# Patient Record
Sex: Male | Born: 1960 | Race: White | Hispanic: No | Marital: Single | Smoking: Never smoker
Health system: Southern US, Community
[De-identification: ages and names within clinical notes are randomized; demographics above are authoritative.]

## PROBLEM LIST (undated history)

## (undated) DIAGNOSIS — N200 Calculus of kidney: Secondary | ICD-10-CM

## (undated) DIAGNOSIS — Z87442 Personal history of urinary calculi: Secondary | ICD-10-CM

## (undated) HISTORY — PX: LITHOTRIPSY: SUR834

---

## 2008-05-11 DIAGNOSIS — Z86018 Personal history of other benign neoplasm: Secondary | ICD-10-CM

## 2008-05-11 HISTORY — DX: Personal history of other benign neoplasm: Z86.018

## 2012-11-03 DIAGNOSIS — N400 Enlarged prostate without lower urinary tract symptoms: Secondary | ICD-10-CM | POA: Insufficient documentation

## 2012-11-03 DIAGNOSIS — N320 Bladder-neck obstruction: Secondary | ICD-10-CM | POA: Insufficient documentation

## 2012-11-03 DIAGNOSIS — N50819 Testicular pain, unspecified: Secondary | ICD-10-CM | POA: Insufficient documentation

## 2012-12-02 DIAGNOSIS — E559 Vitamin D deficiency, unspecified: Secondary | ICD-10-CM | POA: Insufficient documentation

## 2012-12-02 DIAGNOSIS — Z87442 Personal history of urinary calculi: Secondary | ICD-10-CM | POA: Insufficient documentation

## 2015-09-02 ENCOUNTER — Ambulatory Visit: Payer: BLUE CROSS/BLUE SHIELD | Admitting: Certified Registered Nurse Anesthetist

## 2015-09-02 ENCOUNTER — Encounter: Payer: Self-pay | Admitting: Anesthesiology

## 2015-09-02 ENCOUNTER — Encounter
Admission: RE | Disposition: A | Discharge status: Home / Self Care | Payer: Self-pay | Source: Ambulatory Visit | Attending: Unknown Physician Specialty

## 2015-09-02 ENCOUNTER — Ambulatory Visit
Admission: RE | Admit: 2015-09-02 | Discharge: 2015-09-02 | Disposition: A | Discharge status: Home / Self Care | Payer: BLUE CROSS/BLUE SHIELD | Source: Ambulatory Visit | Attending: Unknown Physician Specialty | Admitting: Unknown Physician Specialty

## 2015-09-02 DIAGNOSIS — K640 First degree hemorrhoids: Secondary | ICD-10-CM | POA: Diagnosis not present

## 2015-09-02 DIAGNOSIS — D122 Benign neoplasm of ascending colon: Secondary | ICD-10-CM | POA: Diagnosis not present

## 2015-09-02 DIAGNOSIS — Z1211 Encounter for screening for malignant neoplasm of colon: Secondary | ICD-10-CM | POA: Diagnosis present

## 2015-09-02 HISTORY — PX: COLONOSCOPY WITH PROPOFOL: SHX5780

## 2015-09-02 SURGERY — COLONOSCOPY WITH PROPOFOL
Anesthesia: General

## 2015-09-02 MED ORDER — SODIUM CHLORIDE 0.9 % IV SOLN
INTRAVENOUS | Status: DC
  Administered 2015-09-02: 1000 mL via INTRAVENOUS

## 2015-09-02 MED ORDER — SODIUM CHLORIDE 0.9 % IV SOLN: INTRAVENOUS | Status: DC

## 2015-09-02 MED ORDER — FENTANYL CITRATE (PF) 100 MCG/2ML IJ SOLN
INTRAMUSCULAR | Status: DC | PRN
  Administered 2015-09-02: 50 ug via INTRAVENOUS

## 2015-09-02 MED ORDER — MIDAZOLAM HCL 5 MG/5ML IJ SOLN
INTRAMUSCULAR | Status: DC | PRN
  Administered 2015-09-02: 1 mg via INTRAVENOUS

## 2015-09-02 MED ORDER — LIDOCAINE HCL (CARDIAC) 20 MG/ML IV SOLN
INTRAVENOUS | Status: DC | PRN
  Administered 2015-09-02: 20 mg via INTRAVENOUS

## 2015-09-02 MED ORDER — PROPOFOL 500 MG/50ML IV EMUL
INTRAVENOUS | Status: DC | PRN
  Administered 2015-09-02: 140 ug/kg/min via INTRAVENOUS

## 2015-09-02 MED ORDER — PROPOFOL 10 MG/ML IV BOLUS
INTRAVENOUS | Status: DC | PRN
  Administered 2015-09-02: 40 mg via INTRAVENOUS

## 2015-09-02 NOTE — Op Note (Signed)
[facility] Gastroenterology Patient Name: Keith Jackson Procedure Date: 09/02/2015 7:57 AM MRN: [id number] Account #: [id number] Date of Birth: 01/01/1961 Admit Type: Outpatient Age: 54 Room: [facility] ENDO ROOM 1 Gender: Male Note Status: Finalized Procedure:         Colonoscopy Indications:       Screening for colorectal malignant neoplasm Providers:         Manya Silvas, MD Medicines:         Propofol per Anesthesia Complications:     No immediate complications. Procedure:         Pre-Anesthesia Assessment:                    - After reviewing the risks and benefits, the patient was                     deemed in satisfactory condition to undergo the procedure.                    After obtaining informed consent, the colonoscope was                     passed under direct vision. Throughout the procedure, the                     patient's blood pressure, pulse, and oxygen saturations                     were monitored continuously. The Colonoscope was                     introduced through the anus and advanced to the the cecum,                     identified by appendiceal orifice and ileocecal valve. The                     colonoscopy was performed without difficulty. The patient                     tolerated the procedure well. The quality of the bowel                     preparation was excellent. Findings:      A diminutive polyp was found in the distal ascending colon. The polyp       was sessile. The polyp was removed with a jumbo cold forceps. Resection       and retrieval were complete.      Internal hemorrhoids were found during endoscopy. The hemorrhoids were       small and Grade I (internal hemorrhoids that do not prolapse).      The exam was otherwise without abnormality. Impression:        - One diminutive polyp in the distal ascending colon.                     Resected and retrieved.                    - Internal hemorrhoids.      - The examination was otherwise normal. Recommendation:    - Await pathology results. Manya Silvas, MD 09/02/2015 8:28:33 AM This report has been signed electronically. Number of Addenda: 0 Note Initiated On: 09/02/2015 7:57 AM Scope Withdrawal Time: 0  hours 8 minutes 55 seconds  Total Procedure Duration: 0 hours 12 minutes 56 seconds       [facility]

## 2015-09-02 NOTE — Transfer of Care (Signed)
Immediate Anesthesia Transfer of Care Note  Patient: Keith Jackson  Procedure(s) Performed: Procedure(s): COLONOSCOPY WITH PROPOFOL (N/A)  Patient Location: PACU and Endoscopy Unit  Anesthesia Type:General  Level of Consciousness: awake, alert  and oriented  Airway & Oxygen Therapy: Patient Spontanous Breathing and Patient connected to nasal cannula oxygen  Post-op Assessment: Report given to RN and Post -op Vital signs reviewed and stable  Post vital signs: Reviewed and stable  Last Vitals:  Filed Vitals:   09/02/15 0831 09/02/15 0832  BP: 90/69   Pulse: 85   Temp: 36.1 C 36.1 C  Resp: 19     Complications: No apparent anesthesia complications

## 2015-09-02 NOTE — H&P (Signed)
   Primary Care Physician:  No PCP Per Patient Primary Gastroenterologist:  Dr. Vira Agar  Pre-Procedure History & Physical: HPI:  Keith Jackson is a 54 y.o. male is here for an colonoscopy.   No past medical history on file.  No past surgical history on file.  Prior to Admission medications   Not on File    Allergies as of 07/29/2015  . (Not on File)    No family history on file.  Social History   Social History  . Marital Status: Legally Separated    Spouse Name: N/A  . Number of Children: N/A  . Years of Education: N/A   Occupational History  . Not on file.   Social History Main Topics  . Smoking status: Not on file  . Smokeless tobacco: Not on file  . Alcohol Use: Not on file  . Drug Use: Not on file  . Sexual Activity: Not on file   Other Topics Concern  . Not on file   Social History Narrative  . No narrative on file    Review of Systems: See HPI, otherwise negative ROS  Physical Exam: BP 117/79 mmHg  Pulse 95  Temp(Src) 98.4 F (36.9 C) (Tympanic)  Resp 16  Ht 5\' 9"  (1.753 m)  Wt 72.576 kg (160 lb)  BMI 23.62 kg/m2  SpO2 97% General:   Alert,  pleasant and cooperative in NAD Head:  Normocephalic and atraumatic. Neck:  Supple; no masses or thyromegaly. Lungs:  Clear throughout to auscultation.    Heart:  Regular rate and rhythm. Abdomen:  Soft, nontender and nondistended. Normal bowel sounds, without guarding, and without rebound.   Neurologic:  Alert and  oriented x4;  grossly normal neurologically.  Impression/Plan: [name] is here for an colonoscopy to be performed for screening  Risks, benefits, limitations, and alternatives regarding  colonoscopy have been reviewed with the patient.  Questions have been answered.  All parties agreeable.   Gaylyn Cheers, MD  09/02/2015, 7:56 AM

## 2015-09-02 NOTE — Anesthesia Postprocedure Evaluation (Signed)
Anesthesia Post Note  Patient: Keith Jackson  Procedure(s) Performed: Procedure(s) (LRB): COLONOSCOPY WITH PROPOFOL (N/A)  Patient location during evaluation: Endoscopy Anesthesia Type: General Level of consciousness: awake and alert Pain management: pain level controlled Vital Signs Assessment: post-procedure vital signs reviewed and stable Respiratory status: spontaneous breathing, nonlabored ventilation, respiratory function stable and patient connected to nasal cannula oxygen Cardiovascular status: blood pressure returned to baseline and stable Postop Assessment: no signs of nausea or vomiting Anesthetic complications: no    Last Vitals:  Filed Vitals:   09/02/15 0840 09/02/15 0850  BP: 108/83 114/86  Pulse: 80 78  Temp:    Resp: 18 17    Last Pain: There were no vitals filed for this visit.               Precious Haws Jeneane Pieczynski

## 2015-09-02 NOTE — Anesthesia Procedure Notes (Signed)
Date/Time: 09/02/2015 8:12 AM Performed by: Kennon Holter Pre-anesthesia Checklist: Timeout performed, Patient being monitored, Suction available, Emergency Drugs available and Patient identified Patient Re-evaluated:Patient Re-evaluated prior to inductionOxygen Delivery Method: Nasal cannula Preoxygenation: Pre-oxygenation with 100% oxygen Intubation Type: IV induction

## 2015-09-02 NOTE — Anesthesia Preprocedure Evaluation (Signed)
Anesthesia Evaluation  Patient identified by MRN, date of birth, ID band Patient awake    Reviewed: Allergy & Precautions, H&P , NPO status , Patient's Chart, lab work & pertinent test results  Airway Mallampati: III  TM Distance: >3 FB Neck ROM: full    Dental no notable dental hx. (+) Teeth Intact   Pulmonary neg pulmonary ROS, neg shortness of breath,    Pulmonary exam normal breath sounds clear to auscultation       Cardiovascular Exercise Tolerance: Good negative cardio ROS Normal cardiovascular exam Rhythm:regular Rate:Normal     Neuro/Psych negative neurological ROS  negative psych ROS   GI/Hepatic negative GI ROS, Neg liver ROS,   Endo/Other  negative endocrine ROS  Renal/GU negative Renal ROS  negative genitourinary   Musculoskeletal   Abdominal   Peds  Hematology negative hematology ROS (+)   Anesthesia Other Findings Signs and symptoms suggestive of sleep apnea    BMI    Body Mass Index   23.61 kg/m 2      Reproductive/Obstetrics negative OB ROS                             Anesthesia Physical Anesthesia Plan  ASA: II  Anesthesia Plan: General   Post-op Pain Management:    Induction:   Airway Management Planned:   Additional Equipment:   Intra-op Plan:   Post-operative Plan:   Informed Consent: I have reviewed the patients History and Physical, chart, labs and discussed the procedure including the risks, benefits and alternatives for the proposed anesthesia with the patient or authorized representative who has indicated his/her understanding and acceptance.   Dental Advisory Given  Plan Discussed with: Anesthesiologist, CRNA and Surgeon  Anesthesia Plan Comments:         Anesthesia Quick Evaluation

## 2015-09-03 ENCOUNTER — Encounter: Payer: Self-pay | Admitting: Unknown Physician Specialty

## 2015-09-05 LAB — SURGICAL PATHOLOGY

## 2016-05-11 ENCOUNTER — Other Ambulatory Visit: Payer: Self-pay | Admitting: Internal Medicine

## 2016-05-11 DIAGNOSIS — L719 Rosacea, unspecified: Secondary | ICD-10-CM | POA: Insufficient documentation

## 2016-05-11 DIAGNOSIS — R51 Headache: Principal | ICD-10-CM

## 2016-05-11 DIAGNOSIS — R519 Headache, unspecified: Secondary | ICD-10-CM

## 2016-05-18 ENCOUNTER — Ambulatory Visit
Admission: RE | Admit: 2016-05-18 | Discharge: 2016-05-18 | Disposition: A | Discharge status: Home / Self Care | Payer: Managed Care, Other (non HMO) | Source: Ambulatory Visit | Attending: Internal Medicine | Admitting: Internal Medicine

## 2016-05-18 ENCOUNTER — Telehealth: Payer: Self-pay | Admitting: Radiology

## 2016-05-18 DIAGNOSIS — R51 Headache: Secondary | ICD-10-CM | POA: Insufficient documentation

## 2016-05-18 DIAGNOSIS — R519 Headache, unspecified: Secondary | ICD-10-CM

## 2016-08-27 ENCOUNTER — Other Ambulatory Visit: Payer: Self-pay | Admitting: Specialist

## 2016-08-27 DIAGNOSIS — G935 Compression of brain: Secondary | ICD-10-CM

## 2016-08-29 ENCOUNTER — Ambulatory Visit
Admission: RE | Admit: 2016-08-29 | Discharge: 2016-08-29 | Disposition: A | Discharge status: Home / Self Care | Payer: Managed Care, Other (non HMO) | Source: Ambulatory Visit | Attending: Specialist | Admitting: Specialist

## 2016-08-29 DIAGNOSIS — I6203 Nontraumatic chronic subdural hemorrhage: Secondary | ICD-10-CM | POA: Diagnosis not present

## 2016-08-29 DIAGNOSIS — G935 Compression of brain: Secondary | ICD-10-CM | POA: Diagnosis not present

## 2016-08-29 MED ORDER — GADOBENATE DIMEGLUMINE 529 MG/ML IV SOLN
15.0000 mL | Freq: Once | INTRAVENOUS | Status: AC | PRN
  Administered 2016-08-29: 15 mL via INTRAVENOUS

## 2016-09-05 ENCOUNTER — Other Ambulatory Visit: Payer: Self-pay | Admitting: Specialist

## 2016-09-05 DIAGNOSIS — G4489 Other headache syndrome: Secondary | ICD-10-CM

## 2016-09-18 ENCOUNTER — Other Ambulatory Visit: Payer: Managed Care, Other (non HMO)

## 2017-01-10 DIAGNOSIS — Z008 Encounter for other general examination: Secondary | ICD-10-CM | POA: Insufficient documentation

## 2017-01-31 ENCOUNTER — Observation Stay
Admission: EM | Admit: 2017-01-31 | Discharge: 2017-02-01 | Disposition: A | Discharge status: Home / Self Care | Payer: Managed Care, Other (non HMO) | Attending: Internal Medicine | Admitting: Internal Medicine

## 2017-01-31 ENCOUNTER — Emergency Department: Payer: Managed Care, Other (non HMO)

## 2017-01-31 DIAGNOSIS — R52 Pain, unspecified: Secondary | ICD-10-CM

## 2017-01-31 DIAGNOSIS — Z87442 Personal history of urinary calculi: Secondary | ICD-10-CM | POA: Insufficient documentation

## 2017-01-31 DIAGNOSIS — L719 Rosacea, unspecified: Secondary | ICD-10-CM | POA: Insufficient documentation

## 2017-01-31 DIAGNOSIS — E876 Hypokalemia: Secondary | ICD-10-CM | POA: Insufficient documentation

## 2017-01-31 DIAGNOSIS — N200 Calculus of kidney: Secondary | ICD-10-CM | POA: Diagnosis present

## 2017-01-31 DIAGNOSIS — N1330 Unspecified hydronephrosis: Secondary | ICD-10-CM

## 2017-01-31 DIAGNOSIS — Z5329 Procedure and treatment not carried out because of patient's decision for other reasons: Secondary | ICD-10-CM | POA: Insufficient documentation

## 2017-01-31 DIAGNOSIS — N201 Calculus of ureter: Secondary | ICD-10-CM | POA: Diagnosis not present

## 2017-01-31 DIAGNOSIS — N132 Hydronephrosis with renal and ureteral calculous obstruction: Secondary | ICD-10-CM | POA: Diagnosis not present

## 2017-01-31 DIAGNOSIS — R739 Hyperglycemia, unspecified: Secondary | ICD-10-CM | POA: Insufficient documentation

## 2017-01-31 DIAGNOSIS — R109 Unspecified abdominal pain: Secondary | ICD-10-CM

## 2017-01-31 HISTORY — DX: Personal history of urinary calculi: Z87.442

## 2017-01-31 HISTORY — DX: Calculus of kidney: N20.0

## 2017-01-31 LAB — CBC
HEMATOCRIT: 47.4 % (ref 40.0–52.0)
Hemoglobin: 16.5 g/dL (ref 13.0–18.0)
MCH: 30.8 pg (ref 26.0–34.0)
MCHC: 34.8 g/dL (ref 32.0–36.0)
MCV: 88.7 fL (ref 80.0–100.0)
PLATELETS: 290 10*3/uL (ref 150–440)
RBC: 5.34 MIL/uL (ref 4.40–5.90)
RDW: 12.9 % (ref 11.5–14.5)
WBC: 8 10*3/uL (ref 3.8–10.6)

## 2017-01-31 LAB — COMPREHENSIVE METABOLIC PANEL
ALT: 23 U/L (ref 17–63)
AST: 28 U/L (ref 15–41)
Albumin: 4.3 g/dL (ref 3.5–5.0)
Alkaline Phosphatase: 65 U/L (ref 38–126)
Anion gap: 12 (ref 5–15)
BILIRUBIN TOTAL: 1.5 mg/dL — AB (ref 0.3–1.2)
BUN: 15 mg/dL (ref 6–20)
CO2: 23 mmol/L (ref 22–32)
Calcium: 9.3 mg/dL (ref 8.9–10.3)
Chloride: 103 mmol/L (ref 101–111)
Creatinine, Ser: 0.87 mg/dL (ref 0.61–1.24)
Glucose, Bld: 125 mg/dL — ABNORMAL HIGH (ref 65–99)
Potassium: 3.4 mmol/L — ABNORMAL LOW (ref 3.5–5.1)
Sodium: 138 mmol/L (ref 135–145)
TOTAL PROTEIN: 7 g/dL (ref 6.5–8.1)

## 2017-01-31 LAB — URINALYSIS, COMPLETE (UACMP) WITH MICROSCOPIC
Bacteria, UA: NONE SEEN
Bilirubin Urine: NEGATIVE
GLUCOSE, UA: NEGATIVE mg/dL
KETONES UR: 20 mg/dL — AB
LEUKOCYTES UA: NEGATIVE
Nitrite: NEGATIVE
PH: 7 (ref 5.0–8.0)
Protein, ur: NEGATIVE mg/dL
SPECIFIC GRAVITY, URINE: 1.011 (ref 1.005–1.030)
SQUAMOUS EPITHELIAL / LPF: NONE SEEN

## 2017-01-31 LAB — TSH: TSH: 0.898 u[IU]/mL (ref 0.350–4.500)

## 2017-01-31 LAB — MAGNESIUM: Magnesium: 2 mg/dL (ref 1.7–2.4)

## 2017-01-31 LAB — LIPASE, BLOOD: LIPASE: 27 U/L (ref 11–51)

## 2017-01-31 MED ORDER — ONDANSETRON HCL 4 MG/2ML IJ SOLN: 4.0000 mg | Freq: Four times a day (QID) | INTRAMUSCULAR | Status: DC | PRN

## 2017-01-31 MED ORDER — HYDROMORPHONE HCL 1 MG/ML IJ SOLN
0.5000 mg | INTRAMUSCULAR | Status: AC
  Administered 2017-01-31: 0.5 mg via INTRAVENOUS

## 2017-01-31 MED ORDER — MORPHINE SULFATE (PF) 10 MG/ML IV SOLN
INTRAVENOUS | Status: AC
  Administered 2017-01-31: 6 mg
  Filled 2017-01-31: qty 1

## 2017-01-31 MED ORDER — HYDROMORPHONE HCL 1 MG/ML IJ SOLN
0.5000 mg | INTRAMUSCULAR | Status: AC
  Filled 2017-01-31 (×4): qty 1

## 2017-01-31 MED ORDER — HYDROMORPHONE HCL 1 MG/ML IJ SOLN
1.0000 mg | Freq: Once | INTRAMUSCULAR | Status: AC
  Administered 2017-01-31: 1 mg via INTRAVENOUS
  Filled 2017-01-31: qty 1

## 2017-01-31 MED ORDER — ONDANSETRON HCL 4 MG PO TABS: 4.0000 mg | ORAL_TABLET | Freq: Four times a day (QID) | ORAL | Status: DC | PRN

## 2017-01-31 MED ORDER — ACETAMINOPHEN 650 MG RE SUPP: 650.0000 mg | Freq: Four times a day (QID) | RECTAL | Status: DC | PRN

## 2017-01-31 MED ORDER — HYDROMORPHONE HCL 1 MG/ML IJ SOLN
1.0000 mg | INTRAMUSCULAR | Status: AC
  Administered 2017-01-31: 1 mg via INTRAVENOUS
  Filled 2017-01-31: qty 1

## 2017-01-31 MED ORDER — ONDANSETRON HCL 4 MG/2ML IJ SOLN
4.0000 mg | Freq: Once | INTRAMUSCULAR | Status: AC
  Administered 2017-01-31: 4 mg via INTRAVENOUS
  Filled 2017-01-31: qty 2

## 2017-01-31 MED ORDER — DOCUSATE SODIUM 100 MG PO CAPS
100.0000 mg | ORAL_CAPSULE | Freq: Two times a day (BID) | ORAL | Status: DC
  Administered 2017-01-31: 100 mg via ORAL
  Filled 2017-01-31: qty 1

## 2017-01-31 MED ORDER — POTASSIUM CHLORIDE IN NACL 20-0.9 MEQ/L-% IV SOLN
INTRAVENOUS | Status: DC
  Administered 2017-01-31 – 2017-02-01 (×2): via INTRAVENOUS
  Filled 2017-01-31 (×4): qty 1000

## 2017-01-31 MED ORDER — ONDANSETRON HCL 4 MG/2ML IJ SOLN
INTRAMUSCULAR | Status: AC
  Administered 2017-01-31: 4 mg via INTRAVENOUS
  Filled 2017-01-31: qty 2

## 2017-01-31 MED ORDER — DOXYCYCLINE HYCLATE 20 MG PO TABS
40.0000 mg | ORAL_TABLET | Freq: Every day | ORAL | Status: DC
  Administered 2017-01-31: 40 mg via ORAL
  Filled 2017-01-31 (×2): qty 2

## 2017-01-31 MED ORDER — SENNA 8.6 MG PO TABS: 1.0000 | ORAL_TABLET | Freq: Two times a day (BID) | ORAL | Status: DC

## 2017-01-31 MED ORDER — MORPHINE SULFATE (PF) 4 MG/ML IV SOLN
6.0000 mg | Freq: Once | INTRAVENOUS | Status: DC
Start: 2017-01-31 — End: 2017-02-01

## 2017-01-31 MED ORDER — SODIUM CHLORIDE 0.9 % IV BOLUS (SEPSIS)
1000.0000 mL | Freq: Once | INTRAVENOUS | Status: AC
  Administered 2017-01-31: 1000 mL via INTRAVENOUS

## 2017-01-31 MED ORDER — IBUPROFEN 600 MG PO TABS
600.0000 mg | ORAL_TABLET | ORAL | Status: AC
  Administered 2017-01-31: 600 mg via ORAL
  Filled 2017-01-31: qty 1

## 2017-01-31 MED ORDER — ONDANSETRON HCL 4 MG/2ML IJ SOLN
4.0000 mg | Freq: Once | INTRAMUSCULAR | Status: AC
  Administered 2017-01-31: 4 mg via INTRAVENOUS

## 2017-01-31 MED ORDER — OXYCODONE HCL 5 MG PO TABS
5.0000 mg | ORAL_TABLET | ORAL | Status: DC | PRN
  Administered 2017-01-31: 5 mg via ORAL
  Filled 2017-01-31: qty 1

## 2017-01-31 MED ORDER — ACETAMINOPHEN 325 MG PO TABS: 650.0000 mg | ORAL_TABLET | Freq: Four times a day (QID) | ORAL | Status: DC | PRN

## 2017-01-31 MED ORDER — MORPHINE SULFATE (PF) 4 MG/ML IV SOLN
4.0000 mg | INTRAVENOUS | Status: DC | PRN
  Administered 2017-01-31 – 2017-02-01 (×3): 4 mg via INTRAVENOUS
  Filled 2017-01-31 (×3): qty 1

## 2017-01-31 NOTE — ED Triage Notes (Addendum)
Left sided sharp flank pain that began at 10AM today. Pt has hx of kidney stones. Pt appears to be in a lot of pain. Nausea. Pt holding left side and has trouble walking due to pain. Pt has urinated a small amount since pain began.

## 2017-01-31 NOTE — Consult Note (Addendum)
3:20 PM   Keith Jackson 10/08/1960 [id number]  Referring provider: Dr. Delman Kitten  CC: Left ureteral stone  HPI: The patient is a 56 year old gentleman with a past medical history of nephrolithiasis presented to the hospital with left flank pain. He was diagnosed with a proximal 2.8 mm left ureteral calculus for which urology was consulted.  The patient has endorsed left flank pain that has been intractable to medicine to this point in the emergency department. She denies fevers or chills. He has some nausea and no vomiting. His laboratory values are unremarkable except for 2 numerous to count red blood cells in his urinalysis. His vitals are stable.  The patient has 3 prior stone episodes. One required lithotripsy. He spontaneously passed the other 2. He is unsure of what type of stone that he forms. His first stone was at the age of 27.   PMH: Past Medical History:  Diagnosis Date  . Kidney stone     Surgical History: Past Surgical History:  Procedure Laterality Date  . COLONOSCOPY WITH PROPOFOL N/A 09/02/2015   Procedure: COLONOSCOPY WITH PROPOFOL;  Surgeon: Manya Silvas, MD;  [facility] ENDOSCOPY;  Service: Endoscopy;  Laterality: N/A;     Allergies: No Known Allergies  Family History: No family history on file.  Social History:  has no tobacco, alcohol, and drug history on file.  ROS: 12 pont ROS otherwise negative                                        Physical Exam: BP 138/89   Pulse 95   Temp 98.2 F (36.8 C) (Oral)   Resp 18   Ht 5\' 9"  (1.753 m)   Wt 169 lb (76.7 kg)   SpO2 93%   BMI 24.96 kg/m   Constitutional:  Alert and oriented, No acute distress. HEENT: [place] AT, moist mucus membranes.  Trachea midline, no masses. Cardiovascular: No clubbing, cyanosis, or edema. Respiratory: Normal respiratory effort, no increased work of breathing. GI: Abdomen is soft, nontender, nondistended, no abdominal masses GU: Left CVA  tenderness.  Skin: No rashes, bruises or suspicious lesions. Lymph: No cervical or inguinal adenopathy. Neurologic: Grossly intact, no focal deficits, moving all 4 extremities. Psychiatric: Normal mood and affect.  Laboratory Data: Lab Results  Component Value Date   WBC 8.0 01/31/2017   HGB 16.5 01/31/2017   HCT 47.4 01/31/2017   MCV 88.7 01/31/2017   PLT 290 01/31/2017    Lab Results  Component Value Date   CREATININE 0.87 01/31/2017    No results found for: PSA  No results found for: TESTOSTERONE  No results found for: HGBA1C  Urinalysis    Component Value Date/Time   COLORURINE YELLOW (A) 01/31/2017 1140   APPEARANCEUR CLEAR (A) 01/31/2017 1140   LABSPEC 1.011 01/31/2017 1140   PHURINE 7.0 01/31/2017 1140   GLUCOSEU NEGATIVE 01/31/2017 1140   HGBUR MODERATE (A) 01/31/2017 1140   BILIRUBINUR NEGATIVE 01/31/2017 1140   KETONESUR 20 (A) 01/31/2017 [place] 01/31/2017 1140   NITRITE NEGATIVE 01/31/2017 [place] 01/31/2017 1140    Pertinent Imaging: CLINICAL DATA:  Acute onset left flank pain today. Nausea. Nephrolithiasis.  EXAM: CT ABDOMEN AND PELVIS WITHOUT CONTRAST  TECHNIQUE: Multidetector CT imaging of the abdomen and pelvis was performed following the standard protocol without IV contrast.  COMPARISON:  None.  FINDINGS: Lower chest:  No acute findings.  Hepatobiliary: No masses visualized on this unenhanced exam. Gallbladder is unremarkable.  Pancreas: No mass or inflammatory process visualized on this unenhanced exam.  Spleen:  Within normal limits in size.  Adrenals/Urinary tract: Mild left hydronephrosis is seen due to 3 mm calculus at the left ureteral pelvic junction. A 3 mm nonobstructing calculus also noted in the upper pole of the left kidney. Small fluid attenuation bilateral renal cysts also noted. Unremarkable unopacified urinary bladder.  Stomach/Bowel: No evidence of obstruction,  inflammatory process, or abnormal fluid collections.  Vascular/Lymphatic: No pathologically enlarged lymph nodes identified. No evidence of abdominal aortic aneurysm. Aortic atherosclerosis.  Reproductive:  No mass or other significant abnormality.  Other:  None.  Musculoskeletal:  No suspicious bone lesions identified.  IMPRESSION: Mild left hydronephrosis due to 3 mm calculus at the left ureteropelvic junction.  Tiny nonobstructing left renal calculus.   Assessment & Plan:    1. Left 2.8 mm proximal ureteral stone I had a long discussion with the patient regarding his 2.8 mm left proximal ureteral stone. I did discuss the patient that this is a very passable stone if we get his pain under control. We did discuss medical expulsive therapy with attempt to pass his stone if his pain is able to manage today. If his pain is not able to manage, he will be admitted to the hospital to the hospitalist service overnight to try to work on better pain control. I have reserved a spot for him for cystoscopy, left ureteroscopy, laser lithotripsy, left ureteral stent placement in the morning if his pain does remain uncontrollable. Ideally, that due to the size of the stone it would be better to try to pass it on his own to avoid the risks of undergoing general anesthesia and ureteral instrumentation. However, this pain does not respond to oral pain medications he will need to undergo this surgery in the morning. At this point, it is okay to discharge the patient home from a urology standpoint as long as his pain is managed with oral pain medications. If remains in the hospital with uncontrolled pain overnight, we will plan for left ureteroscopy tomorrow afternoon. If he is discharged home, we will see him back in 2 weeks with a KUB prior.  Nickie Retort, MD  [facility] [address] [place],  [address] [phone]

## 2017-01-31 NOTE — ED Notes (Signed)
Pt given liquid diet meal sent up from cafeteria

## 2017-01-31 NOTE — ED Provider Notes (Signed)
[facility] Emergency Department Provider Note   ____________________________________________   First MD Initiated Contact with Patient 01/31/17 1116     (approximate)  I have reviewed the triage vital signs and the nursing notes.   HISTORY  Chief Complaint Flank Pain    HPI Keith Jackson is a 56 y.o. male reports that about 10 AM today began having sudden severe pain in his left back that radiates towards his left flank. Reports a history of kidney stones in the distant past, and feels that he is having another. Previous stones have passed on their own with 1 lithotripsy in the past.  He recently had a blood patch about a week and a half ago due to headaches, but has not been experiencing any further headaches. He reports feeling very nauseated as though his vomit. Severe 10 out of 10 sharp stabbing pain in the left flank. He was in good health until 10:00 this morning with sudden pain began  Has not urinated since pain started not noticed a blood in his urine yesterday   Past Medical History:  Diagnosis Date  . Kidney stone     There are no active problems to display for this patient.   Past Surgical History:  Procedure Laterality Date  . COLONOSCOPY WITH PROPOFOL N/A 09/02/2015   Procedure: COLONOSCOPY WITH PROPOFOL;  Surgeon: Manya Silvas, MD;  Location: [facility] ENDOSCOPY;  Service: Endoscopy;  Laterality: N/A;    Prior to Admission medications   Medication Sig Start Date End Date Taking? Authorizing Provider  doxycycline (ORACEA) 40 MG capsule Take 40 mg by mouth daily. 09/11/12  Yes [provider]    Allergies Patient has no known allergies.  No family history on file.  Social History Social History  Substance Use Topics  . Smoking status: Does not smoke   . Smokeless tobacco: No illegal drug use   . Alcohol use Occasional alcohol      Review of Systems Constitutional: No fever/chills Eyes: No visual  changes. ENT: No sore throat. Cardiovascular: Denies chest pain. Respiratory: Denies shortness of breath. Gastrointestinal: No vomiting No diarrhea.  No constipation. Genitourinary: Negative for dysuria. Musculoskeletal: Negative for back pain except for his left flank that radiates out. Skin: Negative for rash. Neurological: Negative for headaches, focal weakness or numbness.  10-point ROS otherwise negative.  ____________________________________________   PHYSICAL EXAM:  VITAL SIGNS: ED Triage Vitals  Enc Vitals Group     BP 01/31/17 1105 130/83     Pulse Rate 01/31/17 1105 86     Resp 01/31/17 1105 18     Temp 01/31/17 1105 98.2 F (36.8 C)     Temp Source 01/31/17 1105 Oral     SpO2 01/31/17 1105 98 %     Weight 01/31/17 1105 169 lb (76.7 kg)     Height 01/31/17 1105 5\' 9"  (1.753 m)     Head Circumference --      Peak Flow --      Pain Score 01/31/17 1104 10     Pain Loc --      Pain Edu? --      Excl. in [place]? --     Constitutional: Alert and oriented. The hollering in pain, appears in distress and in severe pain  Eyes: Conjunctivae are normal. PERRL. EOMI. Head: Atraumatic. Nose: No congestion/rhinnorhea. Mouth/Throat: Mucous membranes are moist.  Oropharynx non-erythematous. Neck: No stridor.   Cardiovascular: Normal rate, regular rhythm. Grossly normal heart sounds.  Good peripheral circulation. Respiratory:  Normal respiratory effort.  No retractions. Lungs CTAB. Gastrointestinal: Soft and nontender except for some tenderness to the left flank. No distention. No abdominal bruits.  Musculoskeletal: No lower extremity tenderness nor edema.  No joint effusions. Neurologic:  Normal speech and language. No gross focal neurologic deficits are appreciated.  Skin:  Skin is warm, dry and intact. No rash noted. Psychiatric: Mood and affect are anxious, likely because he is in pain. He is very pleasant ____________________________________________   LABS (all labs  ordered are listed, but only abnormal results are displayed)  Labs Reviewed  COMPREHENSIVE METABOLIC PANEL - Abnormal; Notable for the following:       Result Value   Potassium 3.4 (*)    Glucose, Bld 125 (*)    Total Bilirubin 1.5 (*)    All other components within normal limits  URINALYSIS, COMPLETE (UACMP) WITH MICROSCOPIC - Abnormal; Notable for the following:    Color, Urine YELLOW (*)    APPearance CLEAR (*)    Hgb urine dipstick MODERATE (*)    Ketones, ur 20 (*)    All other components within normal limits  CBC  LIPASE, BLOOD   ____________________________________________  EKG   ____________________________________________  RADIOLOGY  Ct Renal Stone Study  Result Date: 01/31/2017 CLINICAL DATA:  Acute onset left flank pain today. Nausea. Nephrolithiasis. EXAM: CT ABDOMEN AND PELVIS WITHOUT CONTRAST TECHNIQUE: Multidetector CT imaging of the abdomen and pelvis was performed following the standard protocol without IV contrast. COMPARISON:  None. FINDINGS: Lower chest: No acute findings. Hepatobiliary: No masses visualized on this unenhanced exam. Gallbladder is unremarkable. Pancreas: No mass or inflammatory process visualized on this unenhanced exam. Spleen:  Within normal limits in size. Adrenals/Urinary tract: Mild left hydronephrosis is seen due to 3 mm calculus at the left ureteral pelvic junction. A 3 mm nonobstructing calculus also noted in the upper pole of the left kidney. Small fluid attenuation bilateral renal cysts also noted. Unremarkable unopacified urinary bladder. Stomach/Bowel: No evidence of obstruction, inflammatory process, or abnormal fluid collections. Vascular/Lymphatic: No pathologically enlarged lymph nodes identified. No evidence of abdominal aortic aneurysm. Aortic atherosclerosis. Reproductive:  No mass or other significant abnormality. Other:  None. Musculoskeletal:  No suspicious bone lesions identified. IMPRESSION: Mild left hydronephrosis due to 3 mm  calculus at the left ureteropelvic junction. Tiny nonobstructing left renal calculus. Electronically Signed   By: Earle Gell M.D.   On: 01/31/2017 12:36    ____________________________________________   PROCEDURES  Procedure(s) performed: None  Procedures  Critical Care performed: No  ____________________________________________   INITIAL IMPRESSION / ASSESSMENT AND PLAN / ED COURSE  Pertinent labs & imaging results that were available during my care of the patient were reviewed by me and considered in my medical decision making (see chart for details).  Differential diagnosis includes but is not limited to, abdominal perforation, aortic dissection, cholecystitis, appendicitis, diverticulitis, colitis, esophagitis/gastritis, kidney stone, pyelonephritis, urinary tract infection, aortic aneurysm. All are considered in decision and treatment plan. Based upon the patient's presentation and risk factors, suspect patient's likely passing a kidney stone based on clinical exam and history. He has not a recent abdominal CT, and has had 1 previous lithotripsy. We'll proceed with CT scan to further evaluate etiology of today's pain.   Clinical Course as of Feb 01 1552  Thu Jan 31, 2017  1141 Patient was given morphine, reports his pain is severe. He continues to be writhing in pain. Will give Dilaudid at this time, continues reports severe left flank pain. Appears in  severe pain.  [MQ]    Clinical Course User Index [MQ] Delman Kitten, MD   ----------------------------------------- 1:27 PM on 01/31/2017 -----------------------------------------  The patient was feeling improved, but is now sitting upright notable ongoing pain feeling as though he is going to vomit into a basin. He reports his pain is worsen again, and does not feel he can go home. He's had multiple rounds of IV narcotic with some intermittent relief, but now ongoing intractable pain. Have placed a call to urology, who  reviewed case and advises Pain control. Dr. Pilar Jarvis, side ED.  Patient continue to have ongoing pain after brief relief. Discussed with the patient, and he wishes to be admitted to the hospital for ongoing pain control. Urology is seen and consulted as well.     ____________________________________________   FINAL CLINICAL IMPRESSION(S) / ED DIAGNOSES  Final diagnoses:  Intractable pain  Kidney stone on left side      NEW MEDICATIONS STARTED DURING THIS VISIT:  New Prescriptions   No medications on file     Note:  This document was prepared using Dragon voice recognition software and may include unintentional dictation errors.     Delman Kitten, MD 01/31/17 [phone]

## 2017-01-31 NOTE — H&P (Signed)
[facility] [place] NAME: Alroy Portela    MR#:  [id number]  DATE OF BIRTH:  [birth year]  DATE OF ADMISSION:  01/31/2017  PRIMARY CARE PHYSICIAN: Idelle Crouch, MD   REQUESTING/REFERRING PHYSICIAN:   CHIEF COMPLAINT:   Chief Complaint  Patient presents with  . Flank Pain    HISTORY OF PRESENT ILLNESS: Boone Gear  is a 56 y.o. male with a known history of Acne rosacea, kidney stones, who presents to the hospital with complaints of left flank area pain. He had a ventricular the patient, he was doing well up until today in the morning, but he says that he started having left flank pain, pain was described as sharp, constant, and other than by intensity with no acute DVT, aggravating factors. No significant radiation, although patient noted some discomfort in suprapubic area as well bilaterally. On arrival to emergency room, patient required numerous doses of morphine and Dilaudid. CT scan of abdomen and pelvis for kidney stones revealed 3 mm stone in the left ureterovesicle junction.  He was seen by Dr.Budzyn, recommended to get cystoscopy, left ureteroscopy, laser lithotripsy, left ureteral stent placement in the morning, if his pain is uncontrolled. Hospitalist services were contacted for admission.   PAST MEDICAL HISTORY:   Past Medical History:  Diagnosis Date  . Kidney stone     PAST SURGICAL HISTORY: Past Surgical History:  Procedure Laterality Date  . COLONOSCOPY WITH PROPOFOL N/A 09/02/2015   Procedure: COLONOSCOPY WITH PROPOFOL;  Surgeon: Manya Silvas, MD;  Location: [facility] ENDOSCOPY;  Service: Endoscopy;  Laterality: N/A;    SOCIAL HISTORY:  Social History  Substance Use Topics  . Smoking status: Not on file  . Smokeless tobacco: Not on file  . Alcohol use Not on file    FAMILY HISTORY: No family history on file.  DRUG ALLERGIES: No Known Allergies  Review of Systems  Constitutional: Negative for chills,  fever and weight loss.  HENT: Negative for congestion.   Eyes: Negative for blurred vision and double vision.  Respiratory: Negative for cough, sputum production, shortness of breath and wheezing.   Cardiovascular: Negative for chest pain, palpitations, orthopnea, leg swelling and PND.  Gastrointestinal: Positive for nausea. Negative for abdominal pain, blood in stool, constipation, diarrhea and vomiting.  Genitourinary: Positive for flank pain. Negative for dysuria, frequency, hematuria and urgency.  Musculoskeletal: Negative for falls.  Neurological: Negative for dizziness, tremors, focal weakness and headaches.  Endo/Heme/Allergies: Does not bruise/bleed easily.  Psychiatric/Behavioral: Negative for depression. The patient does not have insomnia.     MEDICATIONS AT HOME:  Prior to Admission medications   Medication Sig Start Date End Date Taking? Authorizing Provider  doxycycline (ORACEA) 40 MG capsule Take 40 mg by mouth daily. 09/11/12  Yes [provider]      PHYSICAL EXAMINATION:   VITAL SIGNS: Blood pressure 138/89, pulse 95, temperature 98.2 F (36.8 C), temperature source Oral, resp. rate 18, height 5\' 9"  (1.753 m), weight 76.7 kg (169 lb), SpO2 93 %.  GENERAL:  56 y.o.-year-old patient lying in the bed with no acute distress, Sleepy.  EYES: Pupils equal, round, reactive to light and accommodation. No scleral icterus. Extraocular muscles intact.  HEENT: Head atraumatic, normocephalic. Oropharynx and nasopharynx clear.  NECK:  Supple, no jugular venous distention. No thyroid enlargement, no tenderness.  LUNGS: Normal breath sounds bilaterally, no wheezing, rales,rhonchi or crepitation. No use of accessory muscles of respiration.  CARDIOVASCULAR: S1, S2 normal. No  murmurs, rubs, or gallops.  ABDOMEN: Soft, nontender, nondistended. Bowel sounds present. No organomegaly or mass.  EXTREMITIES: No pedal edema, cyanosis, or clubbing.  NEUROLOGIC: Cranial nerves II  through XII are intact. Muscle strength 5/5 in all extremities. Sensation intact. Gait not checked.  PSYCHIATRIC: The patient is alert and oriented x 3.  SKIN: No obvious rash, lesion, or ulcer.   LABORATORY PANEL:   CBC  Recent Labs Lab 01/31/17 1142  WBC 8.0  HGB 16.5  HCT 47.4  PLT 290  MCV 88.7  MCH 30.8  MCHC 34.8  RDW 12.9   ------------------------------------------------------------------------------------------------------------------  Chemistries   Recent Labs Lab 01/31/17 1142  NA 138  K 3.4*  CL 103  CO2 23  GLUCOSE 125*  BUN 15  CREATININE 0.87  CALCIUM 9.3  AST 28  ALT 23  ALKPHOS 65  BILITOT 1.5*   ------------------------------------------------------------------------------------------------------------------  Cardiac Enzymes No results for input(s): TROPONINI in the last 168 hours. ------------------------------------------------------------------------------------------------------------------  RADIOLOGY: Ct Renal Stone Study  Result Date: 01/31/2017 CLINICAL DATA:  Acute onset left flank pain today. Nausea. Nephrolithiasis. EXAM: CT ABDOMEN AND PELVIS WITHOUT CONTRAST TECHNIQUE: Multidetector CT imaging of the abdomen and pelvis was performed following the standard protocol without IV contrast. COMPARISON:  None. FINDINGS: Lower chest: No acute findings. Hepatobiliary: No masses visualized on this unenhanced exam. Gallbladder is unremarkable. Pancreas: No mass or inflammatory process visualized on this unenhanced exam. Spleen:  Within normal limits in size. Adrenals/Urinary tract: Mild left hydronephrosis is seen due to 3 mm calculus at the left ureteral pelvic junction. A 3 mm nonobstructing calculus also noted in the upper pole of the left kidney. Small fluid attenuation bilateral renal cysts also noted. Unremarkable unopacified urinary bladder. Stomach/Bowel: No evidence of obstruction, inflammatory process, or abnormal fluid collections.  Vascular/Lymphatic: No pathologically enlarged lymph nodes identified. No evidence of abdominal aortic aneurysm. Aortic atherosclerosis. Reproductive:  No mass or other significant abnormality. Other:  None. Musculoskeletal:  No suspicious bone lesions identified. IMPRESSION: Mild left hydronephrosis due to 3 mm calculus at the left ureteropelvic junction. Tiny nonobstructing left renal calculus. Electronically Signed   By: Earle Gell M.D.   On: 01/31/2017 12:36    EKG: No orders found for this or any previous visit.  IMPRESSION AND PLAN:  Active Problems:   Acute left flank pain   Kidney stone   Hydronephrosis of left kidney   Hypokalemia   Hyperglycemia  #1. Acute left flank pain due to mild hydronephrosis, continue IV pain medications for now #2. 3 mm left ureterovesicle junction kidney stone, patient will undergo left ureteroscopy, left ureteral stent placement in the morning . If the pain has not improved, continue IV fluids at a high rate for now. Following urinary output, strain urine #3. Hypokalemia, supplementing intravenously, check magnesium level and supplement if needed #4. Hyperglycemia, get hemoglobin A1c to rule out diabetes All the records are reviewed and case discussed with ED provider. Management plans discussed with the patient, family and they are in agreement.  CODE STATUS: Code Status History    This patient does not have a recorded code status. Please follow your organizational policy for patients in this situation.       TOTAL TIME TAKING CARE OF THIS PATIENT: 50 minutes.    Theodoro Grist M.D on 01/31/2017 at 4:23 PM  Between 7am to 6pm - Pager - [phone] After 6pm go to [email or web address] - password [email or web address] [facility]  [facility]  Office  [phone]  CC: Primary care physician; Fulton Reek  D, MD

## 2017-02-01 ENCOUNTER — Encounter
Admission: EM | Disposition: A | Discharge status: Home / Self Care | Payer: Self-pay | Source: Home / Self Care | Attending: Emergency Medicine

## 2017-02-01 ENCOUNTER — Telehealth: Payer: Self-pay | Admitting: Urology

## 2017-02-01 DIAGNOSIS — N201 Calculus of ureter: Secondary | ICD-10-CM | POA: Diagnosis not present

## 2017-02-01 LAB — BASIC METABOLIC PANEL
ANION GAP: 6 (ref 5–15)
BUN: 13 mg/dL (ref 6–20)
CHLORIDE: 108 mmol/L (ref 101–111)
CO2: 25 mmol/L (ref 22–32)
Calcium: 8.5 mg/dL — ABNORMAL LOW (ref 8.9–10.3)
Creatinine, Ser: 1.24 mg/dL (ref 0.61–1.24)
GFR calc non Af Amer: >60 mL/min (ref >60)
Glucose, Bld: 144 mg/dL — ABNORMAL HIGH (ref 65–99)
POTASSIUM: 4.3 mmol/L (ref 3.5–5.1)
SODIUM: 139 mmol/L (ref 135–145)

## 2017-02-01 LAB — CBC
HEMATOCRIT: 43.3 % (ref 40.0–52.0)
Hemoglobin: 15.1 g/dL (ref 13.0–18.0)
MCH: 30.9 pg (ref 26.0–34.0)
MCHC: 34.8 g/dL (ref 32.0–36.0)
MCV: 89 fL (ref 80.0–100.0)
Platelets: 222 10*3/uL (ref 150–440)
RBC: 4.86 MIL/uL (ref 4.40–5.90)
RDW: 13 % (ref 11.5–14.5)
WBC: 10.6 10*3/uL (ref 3.8–10.6)

## 2017-02-01 LAB — GLUCOSE, CAPILLARY: Glucose-Capillary: 130 mg/dL — ABNORMAL HIGH (ref 65–99)

## 2017-02-01 SURGERY — URETEROSCOPY, WITH LITHOTRIPSY USING HOLMIUM LASER
Anesthesia: Choice | Laterality: Left

## 2017-02-01 MED ORDER — TAMSULOSIN HCL 0.4 MG PO CAPS: 0.4000 mg | ORAL_CAPSULE | Freq: Every day | ORAL | Status: DC

## 2017-02-01 MED ORDER — TAMSULOSIN HCL 0.4 MG PO CAPS
0.4000 mg | ORAL_CAPSULE | Freq: Every day | ORAL | 0 refills | Status: DC
Start: 2017-02-01 — End: 2017-03-14

## 2017-02-01 NOTE — Telephone Encounter (Signed)
done

## 2017-02-01 NOTE — Progress Notes (Signed)
Patient has not had any pain in over 7 hours. He is completely comfortable. Will cancel surgery. Patient to f/u in 2 weeks with KUB prior.

## 2017-02-01 NOTE — Progress Notes (Signed)
Patient discharged to home as ordered. Follow up appointments and discharged instructions given as ordered. IV discontinued site clean dry and intact. Patient is alert and oriented ambulates without assistance. Family at the bedside to take patient home

## 2017-02-01 NOTE — Discharge Summary (Signed)
[facility] [place] NAME: Keith Jackson    MR#:  [id number]  DATE OF BIRTH:  1961-03-26  DATE OF ADMISSION:  01/31/2017 ADMITTING PHYSICIAN: Theodoro Grist, MD  DATE OF DISCHARGE: 02/01/2017 12:20 PM  PRIMARY CARE PHYSICIAN: Idelle Crouch, MD     ADMISSION DIAGNOSIS:  Kidney stone on left side [N20.0] Intractable pain [R52]  DISCHARGE DIAGNOSIS:  Active Problems:   Acute left flank pain   Kidney stone   Hydronephrosis of left kidney   Hypokalemia   Hyperglycemia   SECONDARY DIAGNOSIS:   Past Medical History:  Diagnosis Date  . History of kidney stones   . Kidney stone     .pro HOSPITAL COURSE:   Keith Jackson  is a 56 y.o. male with a known history of acne rosacea, kidney stones, who presents to the hospital with complaints of left flank area pain. According to the patient, he was doing well up until morning of admission, when he started having left flank pain, pain was described as sharp, constant, no radiation, with no alleviating or aggravating factors. No significant radiation, although patient noted some discomfort in suprapubic area as well bilaterally. On arrival to emergency room, patient required numerous doses of morphine and Dilaudid. CT scan of abdomen and pelvis for kidney stones revealed 3 mm stone in the left ureterovesicle junction.  He was seen by Dr.Budzyn, recommended to get cystoscopy, left ureteroscopy, laser lithotripsy, left ureteral stent placement in the morning, if his pain is uncontrolled. The patient was admitted, he was initiated on high rate IV fluids and his condition improved, pain resolved. He thought, he may have passed the stone. He was seen by urologist again in the morning, who recommended discharge patient on Flomax and pain medications and follow-up with him as outpatient. Discussion by problem: #1 3 mm left ureterovesical junction kidney stone, likely passed, patient is to  follow-up with urologist as outpatient, KUB was recommended prior to urology follow-up. Discussed with Dr. Pilar Jarvis today #2. Acute left flank pain, likely due to mild hydronephrosis, resolved #3. Hypokalemia, supplemented intravenously, resolved, magnesium was checked was found to be normal at 2.0 #4. Hyperglycemia, hemoglobin A1c is pending #5. Acne rosacea, continue doxycycline as per home medications DISCHARGE CONDITIONS:   Stable  CONSULTS OBTAINED:  Treatment Team:  Nickie Retort, MD  DRUG ALLERGIES:  No Known Allergies  DISCHARGE MEDICATIONS:   Discharge Medication List as of 02/01/2017 11:28 AM    START taking these medications   Details  tamsulosin (FLOMAX) 0.4 MG CAPS capsule Take 1 capsule (0.4 mg total) by mouth daily., Starting Fri 02/01/2017, Print      CONTINUE these medications which have NOT CHANGED   Details  doxycycline (ORACEA) 40 MG capsule Take 40 mg by mouth daily., Starting Thu 09/11/2012, Historical Med         DISCHARGE INSTRUCTIONS:    The patient is to follow-up with primary care physician and urologist as outpatient, KUB should be done prior to urology follow-up appointment  If you experience worsening of your admission symptoms, develop shortness of breath, life threatening emergency, suicidal or homicidal thoughts you must seek medical attention immediately by calling 911 or calling your MD immediately  if symptoms less severe.  You Must read complete instructions/literature along with all the possible adverse reactions/side effects for all the Medicines you take and that have been prescribed to you. Take any new Medicines after you have completely understood and accept  all the possible adverse reactions/side effects.   Please note  You were cared for by a hospitalist during your hospital stay. If you have any questions about your discharge medications or the care you received while you were in the hospital after you are discharged, you can  call the unit and asked to speak with the hospitalist on call if the hospitalist that took care of you is not available. Once you are discharged, your primary care physician will handle any further medical issues. Please note that NO REFILLS for any discharge medications will be authorized once you are discharged, as it is imperative that you return to your primary care physician (or establish a relationship with a primary care physician if you do not have one) for your aftercare needs so that they can reassess your need for medications and monitor your lab values.    Today   CHIEF COMPLAINT:   Chief Complaint  Patient presents with  . Flank Pain    HISTORY OF PRESENT ILLNESS:     VITAL SIGNS:  Blood pressure 99/65, pulse (!) 102, temperature 98.6 F (37 C), temperature source Oral, resp. rate 16, height 5\' 9"  (1.753 m), weight 76.7 kg (169 lb), SpO2 97 %.  I/O:   Intake/Output Summary (Last 24 hours) at 02/01/17 1504 Last data filed at 02/01/17 0740  Gross per 24 hour  Intake          2360.67 ml  Output             1175 ml  Net          1185.67 ml    PHYSICAL EXAMINATION:  GENERAL:  56 y.o.-year-old patient lying in the bed with no acute distress.  EYES: Pupils equal, round, reactive to light and accommodation. No scleral icterus. Extraocular muscles intact.  HEENT: Head atraumatic, normocephalic. Oropharynx and nasopharynx clear.  NECK:  Supple, no jugular venous distention. No thyroid enlargement, no tenderness.  LUNGS: Normal breath sounds bilaterally, no wheezing, rales,rhonchi or crepitation. No use of accessory muscles of respiration.  CARDIOVASCULAR: S1, S2 normal. No murmurs, rubs, or gallops.  ABDOMEN: Soft, non-tender, non-distended. Bowel sounds present. No organomegaly or mass.  EXTREMITIES: No pedal edema, cyanosis, or clubbing.  NEUROLOGIC: Cranial nerves II through XII are intact. Muscle strength 5/5 in all extremities. Sensation intact. Gait not checked.    PSYCHIATRIC: The patient is alert and oriented x 3.  SKIN: No obvious rash, lesion, or ulcer.   DATA REVIEW:   CBC  Recent Labs Lab 02/01/17 0437  WBC 10.6  HGB 15.1  HCT 43.3  PLT 222    Chemistries   Recent Labs Lab 01/31/17 1142 02/01/17 0437  NA 138 139  K 3.4* 4.3  CL 103 108  CO2 23 25  GLUCOSE 125* 144*  BUN 15 13  CREATININE 0.87 1.24  CALCIUM 9.3 8.5*  MG 2.0  --   AST 28  --   ALT 23  --   ALKPHOS 65  --   BILITOT 1.5*  --     Cardiac Enzymes No results for input(s): TROPONINI in the last 168 hours.  Microbiology Results  No results found for this or any previous visit.  RADIOLOGY:  Ct Renal Stone Study  Result Date: 01/31/2017 CLINICAL DATA:  Acute onset left flank pain today. Nausea. Nephrolithiasis. EXAM: CT ABDOMEN AND PELVIS WITHOUT CONTRAST TECHNIQUE: Multidetector CT imaging of the abdomen and pelvis was performed following the standard protocol without IV contrast. COMPARISON:  None. FINDINGS:  Lower chest: No acute findings. Hepatobiliary: No masses visualized on this unenhanced exam. Gallbladder is unremarkable. Pancreas: No mass or inflammatory process visualized on this unenhanced exam. Spleen:  Within normal limits in size. Adrenals/Urinary tract: Mild left hydronephrosis is seen due to 3 mm calculus at the left ureteral pelvic junction. A 3 mm nonobstructing calculus also noted in the upper pole of the left kidney. Small fluid attenuation bilateral renal cysts also noted. Unremarkable unopacified urinary bladder. Stomach/Bowel: No evidence of obstruction, inflammatory process, or abnormal fluid collections. Vascular/Lymphatic: No pathologically enlarged lymph nodes identified. No evidence of abdominal aortic aneurysm. Aortic atherosclerosis. Reproductive:  No mass or other significant abnormality. Other:  None. Musculoskeletal:  No suspicious bone lesions identified. IMPRESSION: Mild left hydronephrosis due to 3 mm calculus at the left  ureteropelvic junction. Tiny nonobstructing left renal calculus. Electronically Signed   By: Earle Gell M.D.   On: 01/31/2017 12:36    EKG:  No orders found for this or any previous visit.    Management plans discussed with the patient, family and they are in agreement.  CODE STATUS:     Code Status Orders        Start     Ordered   01/31/17 1807  Full code  Continuous     01/31/17 1806    Code Status History    Date Active Date Inactive Code Status Order ID Comments User Context   This patient has a current code status but no historical code status.      TOTAL TIME TAKING CARE OF THIS PATIENT: 40 minutes.   Discussed with urologist and patient's wife, all questions were answered Keith Jackson M.D on 02/01/2017 at 3:04 PM  Between 7am to 6pm - Pager - [phone]  After 6pm go to [email or web address] - password [email or web address] [facility]  Office  [phone]  CC: Primary care physician; Idelle Crouch, MD

## 2017-02-01 NOTE — Progress Notes (Signed)
No pain meds since 3 am (IV dilaudid) No pain currently No n/v  Patient may have passed stone overnight. Saw possible stone in urine canister, but staff did not strain it  Vitals:   01/31/17 1700 01/31/17 1812 01/31/17 2105 02/01/17 0527  BP: (!) 133/96 (!) 153/84 (!) 146/84 99/65  Pulse: 99 88 98 (!) 102  Resp:  16 18 16   Temp:  97.8 F (36.6 C) 97.6 F (36.4 C) 98.6 F (37 C)  TempSrc:  Oral Oral Oral  SpO2: 98% 100% 100% 97%  Weight:      Height:       I/O last 3 completed shifts: In: 2360.7 [I.V.:1360.7; IV Piggyback:1000] Out: 675 [Urine:675] No intake/output data recorded.   NAD Soft NT ND No foley  CBC    Component Value Date/Time   WBC 10.6 02/01/2017 0437   RBC 4.86 02/01/2017 0437   HGB 15.1 02/01/2017 0437   HCT 43.3 02/01/2017 0437   PLT 222 02/01/2017 0437   MCV 89.0 02/01/2017 0437   MCH 30.9 02/01/2017 0437   MCHC 34.8 02/01/2017 0437   RDW 13.0 02/01/2017 0437   BMP Latest Ref Rng & Units 02/01/2017 01/31/2017  Glucose 65 - 99 mg/dL 144(H) 125(H)  BUN 6 - 20 mg/dL 13 15  Creatinine 0.61 - 1.24 mg/dL 1.24 0.87  Sodium 135 - 145 mmol/L 139 138  Potassium 3.5 - 5.1 mmol/L 4.3 3.4(L)  Chloride 101 - 111 mmol/L 108 103  CO2 22 - 32 mmol/L 25 23  Calcium 8.9 - 10.3 mg/dL 8.5(L) 9.3    A/P: 2.5 mm left ureteral stone. Pain currently controlled. No recent pain meds. ? Passed stone (urine was not strained as ordered) -will keep patient NPO for now. Will check on in a few hours. If pain remains controlled with only PO meds or no meds at that time, will cancel surgery. If still symptomatic at that time, will proceed with scheduled surgery around noon today.

## 2017-02-01 NOTE — Telephone Encounter (Signed)
-----   Message from Nickie Retort, MD sent at 02/01/2017  9:11 AM EDT ----- Patient needs to see me in 2 weeks with an KUB prior

## 2017-02-02 LAB — HEMOGLOBIN A1C
Hgb A1c MFr Bld: 5.4 % (ref 4.8–5.6)
MEAN PLASMA GLUCOSE: 108 mg/dL

## 2017-02-02 LAB — HIV ANTIBODY (ROUTINE TESTING W REFLEX): HIV Screen 4th Generation wRfx: NONREACTIVE

## 2017-02-05 NOTE — Telephone Encounter (Signed)
Cancelled app and the patient will bring his stone in, he will make his 1 month follow up app at that time.  Sharyn Lull

## 2017-02-05 NOTE — Telephone Encounter (Signed)
Patient passed his stone and he wants to know if he still needs to get the KUB? He said he has had a lot of radiation over the last few months and wants to know if this is needed? And does he need to keep this app? Can he bring the stone by to have it sent out? He is having no pain and should he keep taking the flomax? Sharyn Lull

## 2017-02-13 ENCOUNTER — Other Ambulatory Visit: Payer: Self-pay

## 2017-02-14 ENCOUNTER — Ambulatory Visit: Payer: Self-pay

## 2017-03-14 ENCOUNTER — Encounter: Payer: Self-pay | Admitting: Urology

## 2017-03-14 ENCOUNTER — Ambulatory Visit (INDEPENDENT_AMBULATORY_CARE_PROVIDER_SITE_OTHER): Payer: Managed Care, Other (non HMO) | Admitting: Urology

## 2017-03-14 VITALS — BP 112/73 | HR 80 | Ht 69.0 in | Wt 163.0 lb

## 2017-03-14 DIAGNOSIS — G960 Cerebrospinal fluid leak: Secondary | ICD-10-CM

## 2017-03-14 DIAGNOSIS — N200 Calculus of kidney: Secondary | ICD-10-CM

## 2017-03-14 DIAGNOSIS — G9608 Other cranial cerebrospinal fluid leak: Secondary | ICD-10-CM | POA: Insufficient documentation

## 2017-03-14 NOTE — Progress Notes (Signed)
03/14/2017 8:57 AM   Keith Jackson 01-30-1961 [id number]  Referring provider: Idelle Crouch, MD [facility] [place], [place] [address]  Chief Complaint  Patient presents with  . Nephrolithiasis    New Patient    HPI: The patient is a 56 year old gentleman with a past medical history of nephrolithiasis presented to the hospital with left flank pain. He was diagnosed with a proximal 2.8 mm left ureteral calculus for which urology was consulted. The patient eventually spontaneously passed this stone.  He is currently having no symptoms.  The patient has 3 prior stone episodes. One required lithotripsy. He spontaneously passed the other 2. He is unsure of what type of stone that he forms. His first stone was at the age of 68.  The stone was composed of 60% calcium oxalate monohydrate, 35% calcium oxalate dihydrate, and 5% calcium phosphate carbonate.  PMH: Past Medical History:  Diagnosis Date  . History of kidney stones   . Kidney stone     Surgical History: Past Surgical History:  Procedure Laterality Date  . COLONOSCOPY WITH PROPOFOL N/A 09/02/2015   Procedure: COLONOSCOPY WITH PROPOFOL;  Surgeon: Manya Silvas, MD;  Location: [facility] ENDOSCOPY;  Service: Endoscopy;  Laterality: N/A;  . LITHOTRIPSY  2010-12    Home Medications:  Allergies as of 03/14/2017   No Known Allergies     Medication List       Accurate as of 03/14/17  8:57 AM. Always use your most recent med list.          ORACEA 40 MG capsule Generic drug:  doxycycline Take 40 mg by mouth daily.       Allergies: No Known Allergies  Family History: Family History  Problem Relation Age of Onset  . Nephrolithiasis Father   . Bladder Cancer Neg Hx   . Kidney cancer Neg Hx   . Prostate cancer Neg Hx     Social History:  reports that he has never smoked. He has never used smokeless tobacco. He reports that he drinks about 1.2 oz of alcohol per week . He reports that  he does not use drugs.  ROS: UROLOGY Frequent Urination?: No Hard to postpone urination?: No Burning/pain with urination?: No Get up at night to urinate?: Yes Leakage of urine?: No Urine stream starts and stops?: No Trouble starting stream?: No Do you have to strain to urinate?: No Blood in urine?: No Urinary tract infection?: No Sexually transmitted disease?: No Injury to kidneys or bladder?: No Painful intercourse?: No Weak stream?: No Erection problems?: No Penile pain?: No  Gastrointestinal Nausea?: No Vomiting?: No Indigestion/heartburn?: No Diarrhea?: No Constipation?: No  Constitutional Fever: No Night sweats?: No Weight loss?: No Fatigue?: No  Skin Skin rash/lesions?: No Itching?: No  Eyes Blurred vision?: No Double vision?: No  Ears/Nose/Throat Sore throat?: No Sinus problems?: No  Hematologic/Lymphatic Swollen glands?: No Easy bruising?: No  Cardiovascular Leg swelling?: No Chest pain?: No  Respiratory Cough?: No Shortness of breath?: No  Endocrine Excessive thirst?: No  Musculoskeletal Back pain?: No Joint pain?: No  Neurological Headaches?: No Dizziness?: No  Psychologic Depression?: No Anxiety?: No  Physical Exam: BP 112/73   Pulse 80   Ht 5\' 9"  (1.753 m)   Wt 163 lb (73.9 kg)   BMI 24.07 kg/m   Constitutional:  Alert and oriented, No acute distress. HEENT: [place] AT, moist mucus membranes.  Trachea midline, no masses. Cardiovascular: No clubbing, cyanosis, or edema. Respiratory: Normal respiratory effort, no  increased work of breathing. GI: Abdomen is soft, nontender, nondistended, no abdominal masses GU: No CVA tenderness.  Skin: No rashes, bruises or suspicious lesions. Lymph: No cervical or inguinal adenopathy. Neurologic: Grossly intact, no focal deficits, moving all 4 extremities. Psychiatric: Normal mood and affect.  Laboratory Data: Lab Results  Component Value Date   WBC 10.6 02/01/2017   HGB 15.1  02/01/2017   HCT 43.3 02/01/2017   MCV 89.0 02/01/2017   PLT 222 02/01/2017    Lab Results  Component Value Date   CREATININE 1.24 02/01/2017    No results found for: PSA  No results found for: TESTOSTERONE  Lab Results  Component Value Date   HGBA1C 5.4 01/31/2017    Urinalysis    Component Value Date/Time   COLORURINE YELLOW (A) 01/31/2017 1140   APPEARANCEUR CLEAR (A) 01/31/2017 1140   LABSPEC 1.011 01/31/2017 1140   PHURINE 7.0 01/31/2017 1140   GLUCOSEU NEGATIVE 01/31/2017 1140   HGBUR MODERATE (A) 01/31/2017 1140   BILIRUBINUR NEGATIVE 01/31/2017 1140   KETONESUR 20 (A) 01/31/2017 1140   PROTEINUR NEGATIVE 01/31/2017 1140   NITRITE NEGATIVE 01/31/2017 [place] 01/31/2017 1140    Assessment & Plan:    1. History of nephrolithiasis I went over the patient's stone analysis and gave him tips to vent future stone formation. He was also given and discussed with the ABCs of stone formation handout. I did discuss a 24-hour urine study with the patient who is not interested in undergoing this at this time. He will follow-up with [place] as needed.  Return if symptoms worsen or fail to improve.  Nickie Retort, MD  [facility] [address], [place], [place] [address] [phone]

## 2020-02-11 ENCOUNTER — Other Ambulatory Visit: Payer: Self-pay

## 2020-02-16 ENCOUNTER — Encounter: Payer: Self-pay | Admitting: Urology

## 2020-02-16 ENCOUNTER — Other Ambulatory Visit: Payer: Self-pay

## 2020-02-16 ENCOUNTER — Ambulatory Visit: Payer: Managed Care, Other (non HMO) | Admitting: Urology

## 2020-02-16 VITALS — BP 118/76 | HR 87 | Ht 69.0 in | Wt 165.0 lb

## 2020-02-16 DIAGNOSIS — N486 Induration penis plastica: Secondary | ICD-10-CM

## 2020-02-16 DIAGNOSIS — N200 Calculus of kidney: Secondary | ICD-10-CM

## 2020-02-16 DIAGNOSIS — N5082 Scrotal pain: Secondary | ICD-10-CM

## 2020-02-16 DIAGNOSIS — R399 Unspecified symptoms and signs involving the genitourinary system: Secondary | ICD-10-CM

## 2020-02-16 DIAGNOSIS — N529 Male erectile dysfunction, unspecified: Secondary | ICD-10-CM

## 2020-02-16 LAB — URINALYSIS, COMPLETE
Bilirubin, UA: NEGATIVE
Glucose, UA: NEGATIVE
Ketones, UA: NEGATIVE
Leukocytes,UA: NEGATIVE
Nitrite, UA: NEGATIVE
Protein,UA: NEGATIVE
Specific Gravity, UA: 1.025 (ref 1.005–1.030)
Urobilinogen, Ur: 0.2 mg/dL (ref 0.2–1.0)
pH, UA: 5.5 (ref 5.0–7.5)

## 2020-02-16 LAB — MICROSCOPIC EXAMINATION
Bacteria, UA: NONE SEEN
Epithelial Cells (non renal): NONE SEEN /hpf (ref 0–10)

## 2020-02-16 MED ORDER — SILDENAFIL CITRATE 20 MG PO TABS: ORAL_TABLET | ORAL | 0 refills | Status: DC

## 2020-02-16 NOTE — Progress Notes (Signed)
02/16/2020 12:18 PM   Keith Jackson Sep 21, 1961 [id number]  Referring provider: Idelle Crouch, MD [facility] [place],  [place] [address]  Chief Complaint  Patient presents with  . Erectile Dysfunction  . Urinary Frequency    HPI: Keith Jackson is a 59 yo male seen at request of Dr. Doy Hutching for evaluation of erectile dysfunction.  He had several problems he wanted to discuss today.  1.  Penile curvature -6 to 8 month history of downward curvature -Brought in a picture with < 30 degree ventral curvature at mid shaft -Denies painful erections -Vague penile discomfort midshaft when not erect described as a "tug" -Was seen [facility] December 2020 for this problem -Given a trial of daily tadalafil for the pain which he could not tolerate secondary to back pain and muscle aches -Curvature has been stable  2.  Erectile dysfunction -6 month history of difficulty achieving and maintaining an erection -SHIM 6/25 -Does note decreased rigidity of early a.m. erections -No significant organic risk factors, non-smoker -When taking the 5 mg tadalafil did see improvement in his erections -Does not have a regular sexual partner at present  3.  Lower urinary tract symptoms -Several month history intermittent urinary stream -Urinary hesitancy, frequency -History of stone disease and had tamsulosin which he started taking with significant improvement in his voiding pattern -Recent PSA reported to be normal  4.  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  History stone disease -Last stone 2018 which he passed -1 prior shockwave lithotripsy -CT 2018 showed a 3 mm left UPJ calculus and 3 mm renal calculus   PMH: Past Medical History:  Diagnosis Date  . History of kidney stones   . Kidney stone     Surgical  History: Past Surgical History:  Procedure Laterality Date  . COLONOSCOPY WITH PROPOFOL N/A 09/02/2015   Procedure: COLONOSCOPY WITH PROPOFOL;  Surgeon: Manya Silvas, MD;  Location: [facility] ENDOSCOPY;  Service: Endoscopy;  Laterality: N/A;  . LITHOTRIPSY  2010-12    Home Medications:  Allergies as of 02/16/2020   No Known Allergies     Medication List       Accurate as of Feb 16, 2020 12:18 PM. If you have any questions, ask your nurse or doctor.        STOP taking these medications   Oracea 40 MG capsule Generic drug: doxycycline Stopped by: Abbie Sons, MD   tadalafil 5 MG tablet Commonly known as: CIALIS Stopped by: Abbie Sons, MD     TAKE these medications   aspirin 81 MG EC tablet Take by mouth.       Allergies: No Known Allergies  Family History: Family History  Problem Relation Age of Onset  . Nephrolithiasis Father   . Bladder Cancer Neg Hx   . Kidney cancer Neg Hx   . Prostate cancer Neg Hx     Social History:  reports that he has never smoked. He has never used smokeless tobacco. He reports current alcohol use of about 2.0 standard drinks of alcohol per week. He reports that he does not use drugs.   Physical Exam: BP 118/76   Pulse 87   Ht 5\' 9"  (1.753 m)   Wt 165 lb (74.8 kg)   BMI 24.37 kg/m   Constitutional:  Alert and oriented, No acute distress. HEENT: [place] AT,  moist mucus membranes.  Trachea midline, no masses. Cardiovascular: No clubbing, cyanosis, or edema. Respiratory: Normal respiratory effort, no increased work of breathing. GI: Abdomen is soft, nontender, nondistended, no abdominal masses GU: Phallus circumcised, no palpable plaques, stretched penile length normal; testes descended bilaterally without masses or tenderness.  Mild prominence right epididymis without tenderness Prostate 35 g, smooth without nodules Skin: No rashes, bruises or suspicious lesions. Neurologic: Grossly intact, no focal deficits, moving all 4  extremities. Psychiatric: Normal mood and affect.   Assessment & Plan:    1.  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  Most common side effects of facial flushing and headache were discussed -If sildenafil not effective recommend evaluation by Dr. Francesca Jewett at [facility] regarding possible venoocclusive disease and Peyronie's evaluation  2.  Penile curvature -Dorsal curvature midshaft -Would not prohibit intercourse except for ED -If sildenafil effective would not recommend any specific treatment unless curvature worsens -Eval as above  3.  Lower urinary tract symptoms -Most likely secondary to BPH -Improved on tamsulosin which he has taken in the past for stones -He declined Rx and indicated he would continue the tamsulosin he has on hand and will call back for an Rx if he desires to continue  4.  Chronic scrotal content pain -Stable -Trial amitriptyline or gabapentin if pain worsens  5.  Nephrolithiasis -No recent imaging -CT 2018 showed a 3 mm renal calculus -KUB recommended  I spent 64 total minutes on the day of the encounter including pre-visit review of the medical record, face-to-face time with the patient, and post visit ordering of labs/imaging/tests/documentation.   Abbie Sons, [facility] [address], [place], [place] [address] [phone]

## 2020-04-18 ENCOUNTER — Other Ambulatory Visit: Payer: Self-pay

## 2020-04-18 ENCOUNTER — Ambulatory Visit (INDEPENDENT_AMBULATORY_CARE_PROVIDER_SITE_OTHER): Payer: Managed Care, Other (non HMO) | Admitting: Dermatology

## 2020-04-18 DIAGNOSIS — L820 Inflamed seborrheic keratosis: Secondary | ICD-10-CM

## 2020-04-18 DIAGNOSIS — L720 Epidermal cyst: Secondary | ICD-10-CM

## 2020-04-18 DIAGNOSIS — L570 Actinic keratosis: Secondary | ICD-10-CM

## 2020-04-18 NOTE — Progress Notes (Signed)
   Follow-Up Visit   Subjective  Keith Jackson is a 59 y.o. male who presents for the following: Actinic Keratosis (scalp, LN2 in past).  The following portions of the chart were reviewed this encounter and updated as appropriate:  Tobacco  Allergies  Meds  Problems  Med Hx  Surg Hx  Fam Hx     Review of Systems:  No other skin or systemic complaints except as noted in HPI or Assessment and Plan.  Objective  Well appearing patient in no apparent distress; mood and affect are within normal limits.  A focused examination was performed including face, scalp, back, chest, arms. Relevant physical exam findings are noted in the Assessment and Plan.  Objective  Scalp/face 16 (16): Pink scaly macules   Objective  R neck, L neck, back, chest, arms x 20 (20): Erythematous keratotic or waxy stuck-on papule or plaque.   Objective  Right medial pectoral: 0.4cm cystic pap  Assessment & Plan    Actinic Damage - diffuse scaly erythematous macules with underlying dyspigmentation - Recommend daily broad spectrum sunscreen SPF 30+ to sun-exposed areas, reapply every 2 hours as needed.  - Call for new or changing lesions.  AK (actinic keratosis) (16) Scalp/face 16  Discussed field txt in the fall, topical vs PDT with ALA  Destruction of lesion - Scalp/face 16 Complexity: simple   Destruction method: cryotherapy   Informed consent: discussed and consent obtained   Timeout:  patient name, date of birth, surgical site, and procedure verified Lesion destroyed using liquid nitrogen: Yes   Region frozen until ice ball extended beyond lesion: Yes   Outcome: patient tolerated procedure well with no complications   Post-procedure details: wound care instructions given    Inflamed seborrheic keratosis (20) R neck, L neck, back, chest, arms x 20  Destruction of lesion - R neck, L neck, back, chest, arms x 20 Complexity: simple   Destruction method: cryotherapy   Informed consent:  discussed and consent obtained   Timeout:  patient name, date of birth, surgical site, and procedure verified Lesion destroyed using liquid nitrogen: Yes   Region frozen until ice ball extended beyond lesion: Yes   Outcome: patient tolerated procedure well with no complications   Post-procedure details: wound care instructions given    Epidermal cyst Right medial pectoral Discussed surgical option Benign, observe  Return in about 6 months (around 10/19/2020) for AK f/u.  I, Othelia Pulling, RMA, am acting as scribe for Sarina Ser, MD .  Documentation: I have reviewed the above documentation for accuracy and completeness, and I agree with the above.  Sarina Ser, MD

## 2020-04-18 NOTE — Patient Instructions (Signed)
Cryotherapy Aftercare  . Wash gently with soap and water everyday.   . Apply Vaseline and Band-Aid daily until healed.  

## 2020-04-19 ENCOUNTER — Encounter: Payer: Self-pay | Admitting: Dermatology

## 2020-10-12 ENCOUNTER — Other Ambulatory Visit: Payer: Self-pay

## 2020-10-12 ENCOUNTER — Ambulatory Visit (INDEPENDENT_AMBULATORY_CARE_PROVIDER_SITE_OTHER): Payer: Managed Care, Other (non HMO) | Admitting: Dermatology

## 2020-10-12 DIAGNOSIS — L720 Epidermal cyst: Secondary | ICD-10-CM

## 2020-10-12 DIAGNOSIS — L578 Other skin changes due to chronic exposure to nonionizing radiation: Secondary | ICD-10-CM

## 2020-10-12 DIAGNOSIS — Z1283 Encounter for screening for malignant neoplasm of skin: Secondary | ICD-10-CM

## 2020-10-12 DIAGNOSIS — L570 Actinic keratosis: Secondary | ICD-10-CM

## 2020-10-12 DIAGNOSIS — L219 Seborrheic dermatitis, unspecified: Secondary | ICD-10-CM

## 2020-10-12 DIAGNOSIS — L719 Rosacea, unspecified: Secondary | ICD-10-CM

## 2020-10-12 MED ORDER — DOXYCYCLINE HYCLATE 50 MG PO CAPS: 50.0000 mg | ORAL_CAPSULE | Freq: Every day | ORAL | 6 refills | Status: DC

## 2020-10-12 NOTE — Progress Notes (Signed)
Follow-Up Visit   Subjective  Keith Jackson is a 60 y.o. male who presents for the following: Actinic Keratosis (Scalp, face, 70m f/u), scaling/scabs (Bil ears, ~74m, moisturizer), and Rosacea (Face, doxycycline 40mg  1 po qd).  The following portions of the chart were reviewed this encounter and updated as appropriate:   Tobacco  Allergies  Meds  Problems  Med Hx  Surg Hx  Fam Hx     Review of Systems:  No other skin or systemic complaints except as noted in HPI or Assessment and Plan.  Objective  Well appearing patient in no apparent distress; mood and affect are within normal limits.  All skin waist up examined.  Objective  scalp x 5 (5): Pink scaly macules   Objective  bil ears: Pink patches with greasy scale.   Objective  Right medial pectoral: 0.7cm cystic pap   Assessment & Plan    Actinic Damage - Severe, chronic, secondary to cumulative UV radiation exposure over time - diffuse scaly erythematous macules and papules with underlying dyspigmentation - Discussed Prescription "Field Treatment" for Severe, Chronic Confluent Actinic Changes with Pre-Cancerous Actinic Keratoses Field treatment involves treatment of an entire area of skin that has confluent Actinic Changes (Sun/ Ultraviolet light damage) and PreCancerous Actinic Keratoses by method of PhotoDynamic Therapy (PDT) and/or prescription Topical Chemotherapy agents such as 5-fluorouracil, 5-fluorouracil/calcipotriene, and/or imiquimod.  The purpose is to decrease the number of clinically evident and subclinical PreCancerous lesions to prevent progression to development of skin cancer by chemically destroying early precancer changes that may or may not be visible.  It has been shown to reduce the risk of developing skin cancer in the treated area. As a result of treatment, redness, scaling, crusting, and open sores may occur during treatment course. One or more than one of these methods may be used and may have to  be used several times to control, suppress and eliminate the PreCancerous changes. Discussed treatment course, expected reaction, and possible side effects. - Recommend daily broad spectrum sunscreen SPF 30+ to sun-exposed areas, reapply every 2 hours as needed.  - Call for new or changing lesions.  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 It is treatable but not curable. It sometimes affects the eyes (ocular rosacea) as well. It may respond to topical and/or systemic medication and can flare with stress, sun exposure, alcohol, exercise and some foods.  Daily application of broad spectrum spf 30+ sunscreen to face is recommended to reduce flares.   Cont Doxycycline 50mg  1 po qd with food and drink  May add Skin medicinals Triple cream if flares  Doxycycline should be taken with food to prevent nausea. Do not lay down for 30 minutes after taking. Be cautious with sun exposure and use good sun protection while on this medication. Pregnant women should not take this medication.    doxycycline (VIBRAMYCIN) 50 MG capsule - Head - Anterior (Face)  AK (actinic keratosis) (5) scalp x 5  Recommend PDT with ALA to forehead/scalp or 5FU/Calcipotriene cream BID x 7 days.  Pt prefers PDT with ALA  Destruction of lesion - scalp x 5 Complexity: simple   Destruction method: cryotherapy   Informed consent: discussed and consent obtained   Timeout:  patient name, date of birth, surgical site, and procedure verified Lesion destroyed using liquid nitrogen: Yes   Region frozen until ice ball extended beyond lesion:  Yes   Outcome: patient tolerated procedure well with no complications   Post-procedure details: wound care instructions given    Seborrheic dermatitis bil ears  Start Cloderm cream qhs up to 4d/wk aa ears prn flares, sample x 1 Lot #  Epidermal cyst Right  medial pectoral  Benign, observe  Discussed excising  Skin cancer screening  Return for Surgery for cyst R medial pectoral April 2022; PDT with ALA scalp in March 2022.  I, Othelia Pulling, RMA, am acting as scribe for Sarina Ser, MD .  Documentation: I have reviewed the above documentation for accuracy and completeness, and I agree with the above.  Sarina Ser, MD

## 2020-10-12 NOTE — Patient Instructions (Signed)
   Pre-Operative Instructions  You are scheduled for a surgical procedure at [facility]. We recommend you read the following instructions. If you have any questions or concerns, please call the office at [phone].  1. Shower and wash the entire body with soap and water the day of your surgery paying special attention to cleansing at and around the planned surgery site.  2. Avoid aspirin or aspirin containing products at least fourteen (14) days prior to your surgical procedure and for at least one week (7 Days) after your surgical procedure. If you take aspirin on a regular basis for heart disease or history of stroke or for any other reason, we may recommend you continue taking aspirin but please notify us if you take this on a regular basis. Aspirin can cause more bleeding to occur during surgery as well as prolonged bleeding and bruising after surgery.   3. Avoid other nonsteroidal pain medications at least one week prior to surgery and at least one week prior to your surgery. These include medications such as Ibuprofen (Motrin, Advil and Nuprin), Naprosyn, Voltaren, Relafen, etc. If medications are used for therapeutic reasons, please inform us as they can cause increased bleeding or prolonged bleeding during and bruising after surgical procedures.   4. Please advise us if you are taking any "blood thinner" medications such as Coumadin or Dipyridamole or Plavix or similar medications. These cause increased bleeding and prolonged bleeding during procedures and bruising after surgical procedures. We may have to consider discontinuing these medications briefly prior to and shortly after your surgery if safe to do so.   5. Please inform us of all medications you are currently taking. All medications that are taken regularly should be taken the day of surgery as you always do. Nevertheless, we need to be informed of what medications you are taking prior to surgery to know whether they will  affect the procedure or cause any complications.   6. Please inform us of any medication allergies. Also inform us of whether you have allergies to Latex or rubber products or whether you have had any adverse reaction to Lidocaine or Epinephrine.  7. Please inform us of any prosthetic or artificial body parts such as artificial heart valve, joint replacements, etc., or similar condition that might require preoperative antibiotics.   8. We recommend avoidance of alcohol at least two weeks prior to surgery and continued avoidance for at least two weeks after surgery.   9. We recommend discontinuation of tobacco smoking at least two weeks prior to surgery and continued abstinence for at least two weeks after surgery.  10. Do not plan strenuous exercise, strenuous work or strenuous lifting for approximately four weeks after your surgery.   11. We request if you are unable to make your scheduled surgical appointment, please call us at least a week in advance or as soon as you are aware of a problem so that we can cancel or reschedule the appointment.   12. You MAY TAKE TYLENOL (acetaminophen) for pain as it is not a blood thinner.   13. PLEASE PLAN TO BE IN TOWN FOR TWO WEEKS FOLLOWING SURGERY, THIS IS IMPORTANT SO YOU CAN BE CHECKED FOR DRESSING CHANGES, SUTURE REMOVAL AND TO MONITOR FOR POSSIBLE COMPLICATIONS.  14.  

## 2020-10-17 ENCOUNTER — Other Ambulatory Visit: Payer: Self-pay

## 2020-10-17 ENCOUNTER — Encounter: Payer: Self-pay | Admitting: Dermatology

## 2020-10-17 DIAGNOSIS — L719 Rosacea, unspecified: Secondary | ICD-10-CM

## 2020-10-17 MED ORDER — DOXYCYCLINE MONOHYDRATE 50 MG PO CAPS: 50.0000 mg | ORAL_CAPSULE | Freq: Every day | ORAL | 1 refills | Status: DC

## 2020-10-22 ENCOUNTER — Other Ambulatory Visit: Payer: Self-pay | Admitting: Dermatology

## 2020-10-24 ENCOUNTER — Other Ambulatory Visit: Payer: Self-pay

## 2020-10-24 MED ORDER — DOXYCYCLINE MONOHYDRATE 50 MG PO TABS: 50.0000 mg | ORAL_TABLET | Freq: Every day | ORAL | 1 refills | Status: DC

## 2020-10-24 NOTE — Telephone Encounter (Signed)
Called pt we received an refill request from [facility] stating pt was wanted to change from Doxycyline 50 mg  capsules to Doxycyline tablets 50 mg tablets.   Discussed with pt we will send the Doxycyline 50 mg tablets #90 1 RF to [facility]

## 2020-12-07 ENCOUNTER — Other Ambulatory Visit: Payer: Self-pay

## 2020-12-07 ENCOUNTER — Ambulatory Visit (INDEPENDENT_AMBULATORY_CARE_PROVIDER_SITE_OTHER): Payer: Managed Care, Other (non HMO)

## 2020-12-07 DIAGNOSIS — L570 Actinic keratosis: Secondary | ICD-10-CM

## 2020-12-07 MED ORDER — AMINOLEVULINIC ACID HCL 20 % EX SOLR
1.0000 "application " | Freq: Once | CUTANEOUS | Status: AC
  Administered 2020-12-07: 354 mg via TOPICAL

## 2020-12-07 NOTE — Patient Instructions (Signed)

## 2020-12-08 DIAGNOSIS — L570 Actinic keratosis: Secondary | ICD-10-CM

## 2020-12-08 NOTE — Progress Notes (Signed)
1. AK (actinic keratosis) Scalp  Photodynamic therapy - Scalp Procedure discussed: discussed risks, benefits, side effects. and alternatives   Prep: site scrubbed/prepped with acetone   Location:  Scalp Number of lesions:  Multiple Type of treatment:  Blue light Aminolevulinic Acid (see MAR for details): Levulan Number of Levulan sticks used:  1 Incubation time (minutes):  960 Number of minutes under lamp:  16 Number of seconds under lamp:  40 Cooling:  Floor fan Outcome: patient tolerated procedure well with no complications   Post-procedure details: sunscreen applied    Aminolevulinic Acid HCl 20 % SOLR 354 mg - Scalp

## 2020-12-08 NOTE — Patient Instructions (Signed)

## 2021-01-04 ENCOUNTER — Other Ambulatory Visit: Payer: Self-pay

## 2021-01-04 ENCOUNTER — Emergency Department
Admission: EM | Admit: 2021-01-04 | Discharge: 2021-01-04 | Disposition: A | Discharge status: Home / Self Care | Payer: Managed Care, Other (non HMO) | Attending: Emergency Medicine | Admitting: Emergency Medicine

## 2021-01-04 ENCOUNTER — Emergency Department: Payer: Managed Care, Other (non HMO)

## 2021-01-04 ENCOUNTER — Encounter: Payer: Self-pay | Admitting: Emergency Medicine

## 2021-01-04 DIAGNOSIS — N132 Hydronephrosis with renal and ureteral calculous obstruction: Secondary | ICD-10-CM | POA: Diagnosis not present

## 2021-01-04 DIAGNOSIS — N200 Calculus of kidney: Secondary | ICD-10-CM

## 2021-01-04 DIAGNOSIS — Z7982 Long term (current) use of aspirin: Secondary | ICD-10-CM | POA: Diagnosis not present

## 2021-01-04 DIAGNOSIS — R109 Unspecified abdominal pain: Secondary | ICD-10-CM | POA: Diagnosis present

## 2021-01-04 LAB — URINALYSIS, COMPLETE (UACMP) WITH MICROSCOPIC
Bacteria, UA: NONE SEEN
Bilirubin Urine: NEGATIVE
Glucose, UA: NEGATIVE mg/dL
Ketones, ur: NEGATIVE mg/dL
Leukocytes,Ua: NEGATIVE
Nitrite: NEGATIVE
Protein, ur: NEGATIVE mg/dL
Specific Gravity, Urine: 1.004 — ABNORMAL LOW (ref 1.005–1.030)
Squamous Epithelial / HPF: NONE SEEN (ref 0–5)
pH: 8 (ref 5.0–8.0)

## 2021-01-04 LAB — CBC
HCT: 44.6 % (ref 39.0–52.0)
Hemoglobin: 16 g/dL (ref 13.0–17.0)
MCH: 30.7 pg (ref 26.0–34.0)
MCHC: 35.9 g/dL (ref 30.0–36.0)
MCV: 85.6 fL (ref 80.0–100.0)
Platelets: 258 10*3/uL (ref 150–400)
RBC: 5.21 MIL/uL (ref 4.22–5.81)
RDW: 12.1 % (ref 11.5–15.5)
WBC: 5.4 10*3/uL (ref 4.0–10.5)
nRBC: 0 % (ref 0.0–0.2)

## 2021-01-04 LAB — BASIC METABOLIC PANEL
Anion gap: 12 (ref 5–15)
BUN: 14 mg/dL (ref 6–20)
CO2: 18 mmol/L — ABNORMAL LOW (ref 22–32)
Calcium: 9.4 mg/dL (ref 8.9–10.3)
Chloride: 107 mmol/L (ref 98–111)
Creatinine, Ser: 1.08 mg/dL (ref 0.61–1.24)
GFR, Estimated: >60 mL/min (ref >60)
Glucose, Bld: 159 mg/dL — ABNORMAL HIGH (ref 70–99)
Potassium: 3.9 mmol/L (ref 3.5–5.1)
Sodium: 137 mmol/L (ref 135–145)

## 2021-01-04 MED ORDER — NAPROXEN 500 MG PO TABS: 500.0000 mg | ORAL_TABLET | Freq: Two times a day (BID) | ORAL | 2 refills | Status: DC

## 2021-01-04 MED ORDER — OXYCODONE-ACETAMINOPHEN 5-325 MG PO TABS: 1.0000 | ORAL_TABLET | ORAL | 0 refills | Status: DC | PRN

## 2021-01-04 MED ORDER — ONDANSETRON 4 MG PO TBDP: 4.0000 mg | ORAL_TABLET | Freq: Three times a day (TID) | ORAL | 0 refills | Status: DC | PRN

## 2021-01-04 MED ORDER — SODIUM CHLORIDE 0.9 % IV BOLUS
1000.0000 mL | Freq: Once | INTRAVENOUS | Status: AC
  Administered 2021-01-04: 1000 mL via INTRAVENOUS

## 2021-01-04 MED ORDER — ONDANSETRON HCL 4 MG/2ML IJ SOLN
4.0000 mg | Freq: Once | INTRAMUSCULAR | Status: AC
  Administered 2021-01-04: 4 mg via INTRAVENOUS
  Filled 2021-01-04: qty 2

## 2021-01-04 MED ORDER — KETOROLAC TROMETHAMINE 30 MG/ML IJ SOLN
30.0000 mg | Freq: Once | INTRAMUSCULAR | Status: AC
  Administered 2021-01-04: 30 mg via INTRAVENOUS
  Filled 2021-01-04: qty 1

## 2021-01-04 MED ORDER — FENTANYL CITRATE (PF) 100 MCG/2ML IJ SOLN
50.0000 ug | INTRAMUSCULAR | Status: DC | PRN
  Administered 2021-01-04: 50 ug via INTRAVENOUS
  Filled 2021-01-04: qty 2

## 2021-01-04 MED ORDER — TAMSULOSIN HCL 0.4 MG PO CAPS
0.4000 mg | ORAL_CAPSULE | Freq: Once | ORAL | Status: AC
  Administered 2021-01-04: 0.4 mg via ORAL
  Filled 2021-01-04: qty 1

## 2021-01-04 MED ORDER — TAMSULOSIN HCL 0.4 MG PO CAPS: 0.4000 mg | ORAL_CAPSULE | Freq: Every day | ORAL | 1 refills | Status: DC

## 2021-01-04 NOTE — ED Notes (Signed)
Pt chose to hold off on pain medication at this time.  Pt states the pain is starting to dissipate a little since check in.

## 2021-01-04 NOTE — ED Triage Notes (Signed)
Pt comes into the ED via POV c/o left flank pain.  Pt has h/o kidney stones that feel the same.  Pt states the pain started at 11:15 this morning.  Pt denies having tried to urinate since the pain started.  Pt very uncomfortable in triage at this time.

## 2021-01-04 NOTE — ED Notes (Signed)
Pt back from CT at this time 

## 2021-01-04 NOTE — ED Provider Notes (Signed)
[facility] Emergency Department Provider Note  ____________________________________________   Event Date/Time   First MD Initiated Contact with Patient 01/04/21 1407     (approximate)  I have reviewed the triage vital signs and the nursing notes.   HISTORY  Chief Complaint Flank Pain    HPI Keith Jackson is a 60 y.o. male presents emergency department complaint of kidney stones.  Patient has history of kidney stones.  States pains in the left flank area and has now moved to the lower left flank area.  He has noticed some hematuria.  No fever or chills.  No vomiting or diarrhea    Past Medical History:  Diagnosis Date  . History of dysplastic nevus 05/11/2008   left mid to upper back/moderate  . History of dysplastic nevus 06/22/2013   left mid to lateral tricep/mild  . History of kidney stones   . Kidney stone     Patient Active Problem List   Diagnosis Date Noted  . 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 Acute left flank pain 01/31/2017  . Kidney stone 01/31/2017  . Hypokalemia 01/31/2017  . Hyperglycemia 01/31/2017  . Encounter for work capability assessment 01/10/2017  . Acne rosacea 05/11/2016  . Vitamin D deficiency 12/02/2012  . Vitamin B12 deficiency (non anemic) 12/02/2012  . History of urinary stone 12/02/2012  . Pain in testicle 11/03/2012  . 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 Benign prostate hyperplasia 11/03/2012    Past Surgical History:  Procedure Laterality Date  . COLONOSCOPY WITH PROPOFOL N/A 09/02/2015   Procedure: COLONOSCOPY WITH PROPOFOL;  Surgeon: Manya Silvas, MD;  Location: [facility] ENDOSCOPY;  Service: Endoscopy;  Laterality: N/A;  . LITHOTRIPSY  2010-12    Prior to Admission medications   Medication Sig Start Date End Date Taking? Authorizing Provider  oxyCODONE-acetaminophen (PERCOCET) 5-325 MG tablet Take 1 tablet by mouth every 4 (four) hours as needed for severe pain. 01/04/21 01/04/22 Yes Sharma Lawrance, Linden Dolin,  PA-C  tamsulosin (FLOMAX) 0.4 MG CAPS capsule Take 1 capsule (0.4 mg total) by mouth daily. 01/04/21  Yes Giabella Duhart, Linden Dolin, PA-C  aspirin 81 MG EC tablet Take by mouth.    [provider]  doxycycline (ADOXA) 50 MG tablet Take 1 tablet (50 mg total) by mouth daily. 10/24/20   Brendolyn Patty, MD  sildenafil (REVATIO) 20 MG tablet 2-5 tabs 1 hour prior to intercourse 02/16/20   Abbie Sons, MD    Allergies Patient has no known allergies.  Family History  Problem Relation Age of Onset  . Nephrolithiasis Father   . Bladder Cancer Neg Hx   . Kidney cancer Neg Hx   . Prostate cancer Neg Hx     Social History Social History   Tobacco Use  . Smoking status: Never Smoker  . Smokeless tobacco: Never Used  Vaping Use  . Vaping Use: Never used  Substance Use Topics  . Alcohol use: Yes    Alcohol/week: 2.0 standard drinks    Types: 2 Cans of beer per week    Comment: 2 times a week  . Drug use: No    Review of Systems  Constitutional: No fever/chills Eyes: No visual changes. ENT: No sore throat. Respiratory: Denies cough Cardiovascular: Denies chest pain Gastrointestinal: Positive abdominal pain Genitourinary: Negative for dysuria. Musculoskeletal: Negative for back pain. Skin: Negative for rash. Psychiatric: no mood changes,     ____________________________________________   PHYSICAL EXAM:  VITAL SIGNS: ED Triage Vitals  Enc Vitals Group  BP 01/04/21 1341 134/70     Pulse Rate 01/04/21 1341 98     Resp 01/04/21 1341 20     Temp 01/04/21 1341 98 F (36.7 C)     Temp Source 01/04/21 1341 Oral     SpO2 01/04/21 1341 100 %     Weight 01/04/21 1337 160 lb (72.6 kg)     Height 01/04/21 1337 5\' 9"  (1.753 m)     Head Circumference --      Peak Flow --      Pain Score 01/04/21 1337 10     Pain Loc --      Pain Edu? --      Excl. in [place]? --     Constitutional: Alert and oriented. Well appearing and in no acute distress. Eyes: Conjunctivae are normal.   Head: Atraumatic. Nose: No congestion/rhinnorhea. Mouth/Throat: Mucous membranes are moist.   Neck:  supple no lymphadenopathy noted Cardiovascular: Normal rate, regular rhythm. Heart sounds are normal Respiratory: Normal respiratory effort.  No retractions, lungs c t a  Abd: soft nontender bs normal all 4 quad, no CVA tenderness GU: deferred Musculoskeletal: FROM all extremities, warm and well perfused Neurologic:  Normal speech and language.  Skin:  Skin is warm, dry and intact. No rash noted. Psychiatric: Mood and affect are normal. Speech and behavior are normal.  ____________________________________________   LABS (all labs ordered are listed, but only abnormal results are displayed)  Labs Reviewed  BASIC METABOLIC PANEL - Abnormal; Notable for the following components:      Result Value   CO2 18 (*)    Glucose, Bld 159 (*)    All other components within normal limits  CBC  URINALYSIS, COMPLETE (UACMP) WITH MICROSCOPIC   ____________________________________________   ____________________________________________  RADIOLOGY  CT renal stone  ____________________________________________   PROCEDURES  Procedure(s) performed: No  Procedures    ____________________________________________   INITIAL IMPRESSION / ASSESSMENT AND PLAN / ED COURSE  Pertinent labs & imaging results that were available during my care of the patient were reviewed by me and considered in my medical decision making (see chart for details).   Patient is a 60 year old male presents with left-sided flank pain.,  Patient appears to be stable.  DDx: Kidney stone, infected kidney stone, UTI, pyelonephritis  CBC is normal, metabolic panel is normal, UA is pending CT renal stone study  Plan at this time is to give patient pain medication evaluate for his stone is located.  Anticipate that patient will be discharged   CT renal stone study shows a 6.4 mm stone at the UPJ, reviewed by me  confirmed by radiology  Consult to urology, page Dr. Erlene Quan  Dr. Erlene Quan scheduled him to see Dr. Bernardo Heater at 1 PM tomorrow.  States to go ahead and give Toradol as he has been taking ASA 81 mg daily and cannot have lithotripsy this week.  Patient is given all instructions.  He was given prescription for Percocet and Flomax.  Care is being transferred to Dr. Corky Downs at this time.  Anticipate he will be discharged in stable condition.  Rodgers Likes was evaluated in Emergency Department on 01/04/2021 for the symptoms described in the history of present illness. He was evaluated in the context of the global COVID-19 pandemic, which necessitated consideration that the patient might be at risk for infection with the SARS-CoV-2 virus that causes COVID-19. Institutional protocols and algorithms that pertain to the evaluation of patients at risk for COVID-19 are in a state of  rapid change based on information released by regulatory bodies including the CDC and federal and state organizations. These policies and algorithms were followed during the patient's care in the ED.    As part of my medical decision making, I reviewed the following data within the [place] notes reviewed and incorporated, Labs reviewed,  Old chart reviewed, Radiograph reviewed , A consult was requested and obtained from this/these consultant(s) Urology, Evaluated by EM attending , Notes from prior ED visits and [place] Controlled Substance Database  ____________________________________________   FINAL CLINICAL IMPRESSION(S) / ED DIAGNOSES  Final diagnoses:  Kidney stone      NEW MEDICATIONS STARTED DURING THIS VISIT:  New Prescriptions   OXYCODONE-ACETAMINOPHEN (PERCOCET) 5-325 MG TABLET    Take 1 tablet by mouth every 4 (four) hours as needed for severe pain.   TAMSULOSIN (FLOMAX) 0.4 MG CAPS CAPSULE    Take 1 capsule (0.4 mg total) by mouth daily.     Note:  This document was prepared using Dragon  voice recognition software and may include unintentional dictation errors.    Versie Starks, PA-C 01/04/21 1619    Lavonia Drafts, MD 01/04/21 [phone]

## 2021-01-04 NOTE — Discharge Instructions (Addendum)
Follow-up with Dr. Bernardo Heater tomorrow at 1 PM.  Address is listed on your discharge papers Take pain medication as needed.  Flomax daily.  We will start Flomax tomorrow

## 2021-01-04 NOTE — ED Notes (Signed)
Pt to CT at this time.

## 2021-01-05 ENCOUNTER — Ambulatory Visit
Admission: RE | Admit: 2021-01-05 | Discharge: 2021-01-05 | Disposition: A | Discharge status: Home / Self Care | Payer: Managed Care, Other (non HMO) | Source: Ambulatory Visit | Attending: Urology | Admitting: Urology

## 2021-01-05 ENCOUNTER — Encounter: Payer: Self-pay | Admitting: Urology

## 2021-01-05 ENCOUNTER — Ambulatory Visit (INDEPENDENT_AMBULATORY_CARE_PROVIDER_SITE_OTHER): Payer: Managed Care, Other (non HMO) | Admitting: Urology

## 2021-01-05 VITALS — BP 124/78 | HR 95 | Ht 69.0 in | Wt 160.0 lb

## 2021-01-05 DIAGNOSIS — N201 Calculus of ureter: Secondary | ICD-10-CM | POA: Diagnosis not present

## 2021-01-05 DIAGNOSIS — N132 Hydronephrosis with renal and ureteral calculous obstruction: Secondary | ICD-10-CM

## 2021-01-05 DIAGNOSIS — N23 Unspecified renal colic: Secondary | ICD-10-CM | POA: Diagnosis not present

## 2021-01-05 DIAGNOSIS — N200 Calculus of kidney: Secondary | ICD-10-CM | POA: Insufficient documentation

## 2021-01-05 NOTE — Progress Notes (Signed)
01/05/2021 1:15 PM   [name] 11/30/1960 [id number]  Referring provider: Idelle Crouch, MD [facility] [place],  [place] [address]  Chief Complaint  Patient presents with  . Nephrolithiasis    HPI: 60 y.o. male presents for follow-up of recent ED visit for renal colic.   Presented to [facility] ED 01/04/2021 complaining of left flank pain radiating to left lower quadrant  + Nausea without vomiting  - Fever/chills  Pain controlled in ED with parenteral analgesics  Had recurrent pain last night ~ 9 PM which was persistent until around 9 AM this morning  Stone protocol CT with a 4 x 6 mm left proximal ureteral calculus with mild to moderate hydronephrosis and a small, nonobstructing right renal calculus  Has had several prior SWLs, no previous ureteroscopy   PMH: Past Medical History:  Diagnosis Date  . History of dysplastic nevus 05/11/2008   left mid to upper back/moderate  . History of dysplastic nevus 06/22/2013   left mid to lateral tricep/mild  . History of kidney stones   . Kidney stone     Surgical History: Past Surgical History:  Procedure Laterality Date  . COLONOSCOPY WITH PROPOFOL N/A 09/02/2015   Procedure: COLONOSCOPY WITH PROPOFOL;  Surgeon: Manya Silvas, MD;  Location[facility] ENDOSCOPY;  Service: Endoscopy;  Laterality: N/A;  . LITHOTRIPSY  2010-12    Home Medications:  Allergies as of 01/05/2021   No Known Allergies     Medication List       Accurate as of January 05, 2021  1:15 PM. If you have any questions, ask your nurse or doctor.        aspirin 81 MG EC tablet Take by mouth.   doxycycline 50 MG tablet Commonly known as: ADOXA Take 1 tablet (50 mg total) by mouth daily.   naproxen 500 MG tablet Commonly known as: Naprosyn Take 1 tablet (500 mg total) by mouth 2 (two) times daily with a meal.   ondansetron 4 MG disintegrating tablet Commonly known as: Zofran ODT Take 1 tablet (4 mg total) by  mouth every 8 (eight) hours as needed.   oxyCODONE-acetaminophen 5-325 MG tablet Commonly known as: Percocet Take 1 tablet by mouth every 4 (four) hours as needed for severe pain.   sildenafil 20 MG tablet Commonly known as: REVATIO 2-5 tabs 1 hour prior to intercourse   tamsulosin 0.4 MG Caps capsule Commonly known as: FLOMAX Take 1 capsule (0.4 mg total) by mouth daily.       Allergies: No Known Allergies  Family History: Family History  Problem Relation Age of Onset  . Nephrolithiasis Father   . Bladder Cancer Neg Hx   . Kidney cancer Neg Hx   . Prostate cancer Neg Hx     Social History:  reports that he has never smoked. He has never used smokeless tobacco. He reports current alcohol use of about 2.0 standard drinks of alcohol per week. He reports that he does not use drugs.   Physical Exam: BP 124/78   Pulse 95   Ht 5\' 9"  (1.753 m)   Wt 160 lb (72.6 kg)   BMI 23.63 kg/m   Constitutional:  Alert and oriented, No acute distress. HEENT: [place] AT, moist mucus membranes.  Trachea midline, no masses. Cardiovascular: No clubbing, cyanosis, or edema. Respiratory: Normal respiratory effort, no increased work of breathing. Skin: No rashes, bruises or suspicious lesions. Neurologic: Grossly intact, no focal deficits, moving all 4 extremities. Psychiatric: Normal  mood and affect.  Laboratory Data:  Urinalysis Dipstick trace blood/negative leukocyte Microscopy 3-10 RBC/0 WBC  Pertinent Imaging: CT images were personally reviewed and interpreted  CT Renal Stone Study  Narrative CLINICAL DATA:  Left-sided flank pain.  EXAM: CT ABDOMEN AND PELVIS WITHOUT CONTRAST  TECHNIQUE: Multidetector CT imaging of the abdomen and pelvis was performed following the standard protocol without IV contrast.  COMPARISON:  CT scan 02/09/2017  FINDINGS: Lower chest: The lung bases are clear of acute process. No pleural effusion or pulmonary lesions. The heart is normal in size.  No pericardial effusion. The distal esophagus and aorta are unremarkable.  Hepatobiliary: Vague low-attenuation lesion in the left hepatic dome present on the prior study but slightly larger. Likely benign cyst. No worrisome hepatic lesions or intrahepatic biliary dilatation. The gallbladder is unremarkable. No common bile duct dilatation.  Pancreas: No mass, inflammation or ductal dilatation.  Spleen: Normal size.  No focal lesions.  Adrenals/Urinary Tract: The adrenal glands are normal.  Mild left-sided hydronephrosis and upper left hydroureter due to a 6 x 4 mm calculus just below the UPJ. No distal ureteral calculi.  Small midpole right renal calculus but no obstructing right-sided ureteral calculi. No bladder calculi or bladder mass.  Bilateral simple appearing renal cysts. No worrisome renal lesions are identified without contrast.  Stomach/Bowel: The stomach, duodenum, small bowel and colon are grossly normal without oral contrast. No inflammatory changes, mass lesions or obstructive findings. The appendix is normal.  Vascular/Lymphatic: Moderate age advanced atherosclerotic calcifications involving the abdominal aorta and iliac arteries. No aneurysm. No mesenteric or retroperitoneal mass or adenopathy. Small scattered lymph nodes are noted.  Reproductive: The prostate gland and seminal vesicles are unremarkable.  Other: No pelvic mass or adenopathy. No free pelvic fluid collections. No inguinal mass or adenopathy. No abdominal wall hernia or subcutaneous lesions.  Musculoskeletal: No significant bony findings.  IMPRESSION: 1. 6 x 4 mm left upper ureteral calculus causing mild left-sided hydronephrosis and upper left hydroureter. 2. Small midpole right renal calculus but no obstructing right-sided ureteral calculi. 3. Bilateral simple appearing renal cysts. 4. No other significant abdominal/pelvic findings, mass lesions or adenopathy. 5. Age advanced  atherosclerotic calcifications involving the abdominal aorta and iliac arteries. 6. Aortic atherosclerosis.  Aortic Atherosclerosis (ICD10-I70.0).   Electronically Signed By: Marijo Sanes M.D. On: 01/04/2021 15:43   Assessment & Plan:    1.  Left proximal ureteral calculus We discussed various treatment options for urolithiasis including observation with or without medical expulsive therapy, shockwave lithotripsy (SWL), ureteroscopy and laser lithotripsy with stent placement. We discussed that management is based on stone size, location, density, patient co-morbidities, and patient preference.  Stones <2mm in size have a >80% spontaneous passage rate. Data surrounding the use of tamsulosin for medical expulsive therapy is controversial, but meta analyses suggests it is most efficacious for distal stones between 5-59mm in size. Possible side effects include dizziness/lightheadedness, and retrograde ejaculation. SWL has a lower stone free rate in a single procedure, but also a lower complication rate compared to ureteroscopy and avoids a stent and associated stent related symptoms. Possible complications include renal hematoma, steinstrasse, and need for additional treatment. Ureteroscopy with laser lithotripsy and stent placement has a higher stone free rate than SWL in a single procedure, however increased complication rate including possible infection, ureteral injury, bleeding, and stent related morbidity. Common stent related symptoms include dysuria, urgency/frequency, and flank pain. After an extensive discussion of the risks and benefits of the above treatment options, the patient  would like to proceed with SWL.  It would not be available until next Thursday.  If he has significant pain over the weekend not controlled p.o. analgesics was instructed to proceed to ED for pain control.  He could potentially be admitted for ureteroscopy  He did inquire if mobile lithotripsy was available  in the area any sooner than next Thursday.  He was informed that it is in [place] on Mondays however he would need an appointment with one of the providers at [facility] on Monday morning and be added onto the schedule.  He was instructed he could call Monday morning and we could try to get this arranged.  2.  Renal colic  As above  3.  Nephrolithiasis  Would recommend metabolic evaluation after current episode resolves    Abbie Sons, MD  [facility] [address], [place], [place] [address] [phone]

## 2021-01-07 ENCOUNTER — Emergency Department (HOSPITAL_COMMUNITY)
Admission: EM | Admit: 2021-01-07 | Discharge: 2021-01-07 | Disposition: A | Discharge status: Home / Self Care | Payer: Managed Care, Other (non HMO) | Attending: Emergency Medicine | Admitting: Emergency Medicine

## 2021-01-07 ENCOUNTER — Encounter (HOSPITAL_COMMUNITY): Payer: Self-pay

## 2021-01-07 ENCOUNTER — Other Ambulatory Visit: Payer: Self-pay

## 2021-01-07 ENCOUNTER — Emergency Department (HOSPITAL_COMMUNITY): Payer: Managed Care, Other (non HMO)

## 2021-01-07 ENCOUNTER — Encounter: Payer: Self-pay | Admitting: Urology

## 2021-01-07 ENCOUNTER — Emergency Department (HOSPITAL_COMMUNITY)
Admission: EM | Admit: 2021-01-07 | Discharge: 2021-01-07 | Discharge status: Against Medical Advice | Payer: Managed Care, Other (non HMO)

## 2021-01-07 DIAGNOSIS — R109 Unspecified abdominal pain: Secondary | ICD-10-CM | POA: Diagnosis present

## 2021-01-07 DIAGNOSIS — Z20822 Contact with and (suspected) exposure to covid-19: Secondary | ICD-10-CM | POA: Insufficient documentation

## 2021-01-07 DIAGNOSIS — N201 Calculus of ureter: Secondary | ICD-10-CM | POA: Insufficient documentation

## 2021-01-07 DIAGNOSIS — Z7982 Long term (current) use of aspirin: Secondary | ICD-10-CM | POA: Diagnosis not present

## 2021-01-07 DIAGNOSIS — N23 Unspecified renal colic: Secondary | ICD-10-CM

## 2021-01-07 LAB — COMPREHENSIVE METABOLIC PANEL
ALT: 17 U/L (ref 0–44)
AST: 23 U/L (ref 15–41)
Albumin: 4.4 g/dL (ref 3.5–5.0)
Alkaline Phosphatase: 64 U/L (ref 38–126)
Anion gap: 11 (ref 5–15)
BUN: 17 mg/dL (ref 6–20)
CO2: 22 mmol/L (ref 22–32)
Calcium: 9.2 mg/dL (ref 8.9–10.3)
Chloride: 106 mmol/L (ref 98–111)
Creatinine, Ser: 1.32 mg/dL — ABNORMAL HIGH (ref 0.61–1.24)
GFR, Estimated: >60 mL/min (ref >60)
Glucose, Bld: 130 mg/dL — ABNORMAL HIGH (ref 70–99)
Potassium: 4 mmol/L (ref 3.5–5.1)
Sodium: 139 mmol/L (ref 135–145)
Total Bilirubin: 1.2 mg/dL (ref 0.3–1.2)
Total Protein: 7.7 g/dL (ref 6.5–8.1)

## 2021-01-07 LAB — URINALYSIS, ROUTINE W REFLEX MICROSCOPIC
Bacteria, UA: NONE SEEN
Bilirubin Urine: NEGATIVE
Glucose, UA: NEGATIVE mg/dL
Ketones, ur: 80 mg/dL — AB
Leukocytes,Ua: NEGATIVE
Nitrite: NEGATIVE
Protein, ur: NEGATIVE mg/dL
Specific Gravity, Urine: 1.023 (ref 1.005–1.030)
pH: 5 (ref 5.0–8.0)

## 2021-01-07 LAB — CBC WITH DIFFERENTIAL/PLATELET
Abs Immature Granulocytes: 0.04 10*3/uL (ref 0.00–0.07)
Basophils Absolute: 0 10*3/uL (ref 0.0–0.1)
Basophils Relative: 0 %
Eosinophils Absolute: 0 10*3/uL (ref 0.0–0.5)
Eosinophils Relative: 0 %
HCT: 47.7 % (ref 39.0–52.0)
Hemoglobin: 16.6 g/dL (ref 13.0–17.0)
Immature Granulocytes: 0 %
Lymphocytes Relative: 6 %
Lymphs Abs: 0.7 10*3/uL (ref 0.7–4.0)
MCH: 31.1 pg (ref 26.0–34.0)
MCHC: 34.8 g/dL (ref 30.0–36.0)
MCV: 89.3 fL (ref 80.0–100.0)
Monocytes Absolute: 0.9 10*3/uL (ref 0.1–1.0)
Monocytes Relative: 8 %
Neutro Abs: 9.3 10*3/uL — ABNORMAL HIGH (ref 1.7–7.7)
Neutrophils Relative %: 86 %
Platelets: 246 10*3/uL (ref 150–400)
RBC: 5.34 MIL/uL (ref 4.22–5.81)
RDW: 12.3 % (ref 11.5–15.5)
WBC: 10.9 10*3/uL — ABNORMAL HIGH (ref 4.0–10.5)
nRBC: 0 % (ref 0.0–0.2)

## 2021-01-07 MED ORDER — HYDROMORPHONE HCL 4 MG PO TABS: 4.0000 mg | ORAL_TABLET | ORAL | 0 refills | Status: DC | PRN

## 2021-01-07 MED ORDER — ONDANSETRON HCL 4 MG/2ML IJ SOLN
4.0000 mg | Freq: Once | INTRAMUSCULAR | Status: AC
  Administered 2021-01-07: 4 mg via INTRAVENOUS
  Filled 2021-01-07: qty 2

## 2021-01-07 MED ORDER — HYDROMORPHONE HCL 1 MG/ML IJ SOLN
1.0000 mg | Freq: Once | INTRAMUSCULAR | Status: AC
  Administered 2021-01-07: 1 mg via INTRAVENOUS
  Filled 2021-01-07: qty 1

## 2021-01-07 MED ORDER — SODIUM CHLORIDE 0.9 % IV BOLUS
1000.0000 mL | Freq: Once | INTRAVENOUS | Status: AC
  Administered 2021-01-07: 1000 mL via INTRAVENOUS

## 2021-01-07 MED ORDER — FENTANYL CITRATE (PF) 100 MCG/2ML IJ SOLN
100.0000 ug | Freq: Once | INTRAMUSCULAR | Status: AC
Start: 2021-01-07 — End: 2021-01-07
  Administered 2021-01-07: 100 ug via INTRAVENOUS
  Filled 2021-01-07: qty 2

## 2021-01-07 NOTE — ED Triage Notes (Signed)
Emergency Medicine Provider Triage Evaluation Note  Keith Jackson , a 60 y.o. male  was evaluated in triage.  Pt complains of worsening left flank pain.  Patient was seen at [facility] on 01/04/2021 and diagnosed with a 4 x 6 mm left proximal ureteral calculus with mild to moderate hydronephrosis.  Patient reports minimal relief with prescribed narcotic medication.  Patient endorses chills last night.  Patient was seen by urologist Dr.Stoioff and is scheduled for shockwave lithotripsy on 01/12/2021.  Review of Systems  Positive: Flank pain, lower abdominal pain, chills Negative: Dysuria, hematuria, difficulty urinating, fever, nausea, vomiting  Physical Exam  BP 139/81 (BP Location: Left Arm)   Pulse 98   Temp 98.2 F (36.8 C) (Oral)   Resp 18   SpO2 95%  Gen:   Awake, no distress   HEENT:  Atraumatic  Resp:  Normal effort  Cardiac:  Normal rate  Abd:   Nondistended, soft, nontender MSK:   Moves extremities without difficulty  Neuro:  Speech clear   Medical Decision Making  Medically screening exam initiated at 12:42 PM.  Appropriate orders placed.  Taven Strite was informed that the remainder of the evaluation will be completed by another provider, this initial triage assessment does not replace that evaluation, and the importance of remaining in the ED until their evaluation is complete.  Clinical Impression   The patient appears stable so that the remainder of the work up may be completed by another provider.     Loni Beckwith, [place] 01/07/21 1244

## 2021-01-07 NOTE — ED Notes (Signed)
Patient did not allow to reassess vital signs until his pain is under control.

## 2021-01-07 NOTE — ED Notes (Addendum)
Pt refusing again to be triaged.  States that he is leaving and that his brother received information that the urologist are at [facility].  Informed him again that we would be happy to see him and try to get his pain under control and he states no he only wants to go where the lithotripsy truck is at.

## 2021-01-07 NOTE — ED Notes (Signed)
Pt. Requesting the lithotripsy trailer here. Unable to provide the information.

## 2021-01-07 NOTE — Consult Note (Signed)
Consult: left ureteral stone  Requested by: Dr. Aletta Edouard   History of Present Illness: Keith Jackson is a 60 year old male with a history of a left proximal stone.  He saw Dr. Bernardo Heater 2 days ago and was not eligible for shockwave lithotripsy because of NSAID use.  Patient desires shockwave lithotripsy and knew that [facility] did it here in [place] on Mondays.  He went to [facility] to inquire about shockwave today but they said they did not do it and he found out it was done at [place] [facility] and came to this emergency room.  His pain is controlled and he has no fever.  He would like to be added on for Monday for shockwave if possible.  CT scan was done 01/04/2021 revealing the 6 mm left proximal stone.  KUB was done on 01/05/2021 and the stone is visible on KUB.  His urine has no bacteria.  Past Medical History:  Diagnosis Date  . History of dysplastic nevus 05/11/2008   left mid to upper back/moderate  . History of dysplastic nevus 06/22/2013   left mid to lateral tricep/mild  . History of kidney stones   . Kidney stone    Past Surgical History:  Procedure Laterality Date  . COLONOSCOPY WITH PROPOFOL N/A 09/02/2015   Procedure: COLONOSCOPY WITH PROPOFOL;  Surgeon: Manya Silvas, MD;  Location: [facility] ENDOSCOPY;  Service: Endoscopy;  Laterality: N/A;  . LITHOTRIPSY  2010-12    Home Medications:  (Not in a hospital admission)  Allergies: No Known Allergies  Family History  Problem Relation Age of Onset  . Nephrolithiasis Father   . Bladder Cancer Neg Hx   . Kidney cancer Neg Hx   . Prostate cancer Neg Hx    Social History:  reports that he has never smoked. He has never used smokeless tobacco. He reports current alcohol use of about 2.0 standard drinks of alcohol per week. He reports that he does not use drugs.  ROS: A complete review of systems was performed.  All systems are negative except for pertinent findings as noted. Review of Systems  Genitourinary: Positive for flank  pain.  All other systems reviewed and are negative.    Physical Exam:  Vital signs in last 24 hours: Temp:  [98.2 F (36.8 C)] 98.2 F (36.8 C) (04/16 1132) Pulse Rate:  [92-98] 92 (04/16 1714) Resp:  [16-18] 16 (04/16 1714) BP: (132-139)/(72-84) 132/84 (04/16 1714) SpO2:  [95 %-99 %] 99 % (04/16 1714) General:  Alert and oriented, No acute distress HEENT: Normocephalic, atraumatic Neck: No JVD or lymphadenopathy Cardiovascular: Regular rate and rhythm Lungs: Regular rate and effort Abdomen: Soft, nontender, nondistended, no abdominal masses Back: No CVA tenderness Extremities: No edema Neurologic: Grossly intact  Laboratory Data:  Results for orders placed or performed during the hospital encounter of 01/07/21 (from the past 24 hour(s))  Urinalysis, Routine w reflex microscopic Urine, Clean Catch     Status: Abnormal   Collection Time: 01/07/21 12:46 PM  Result Value Ref Range   Color, Urine AMBER (A) YELLOW   APPearance CLEAR CLEAR   Specific Gravity, Urine 1.023 1.005 - 1.030   pH 5.0 5.0 - 8.0   Glucose, UA NEGATIVE NEGATIVE mg/dL   Hgb urine dipstick MODERATE (A) NEGATIVE   Bilirubin Urine NEGATIVE NEGATIVE   Ketones, ur 80 (A) NEGATIVE mg/dL   Protein, ur NEGATIVE NEGATIVE mg/dL   Nitrite NEGATIVE NEGATIVE   Leukocytes,Ua NEGATIVE NEGATIVE   RBC / HPF 11-20 0 - 5 RBC/hpf  WBC, UA 0-5 0 - 5 WBC/hpf   Bacteria, UA NONE SEEN NONE SEEN   Mucus PRESENT   CBC with Differential (PNL)     Status: Abnormal   Collection Time: 01/07/21  1:10 PM  Result Value Ref Range   WBC 10.9 (H) 4.0 - 10.5 K/uL   RBC 5.34 4.22 - 5.81 MIL/uL   Hemoglobin 16.6 13.0 - 17.0 g/dL   HCT 47.7 39.0 - 52.0 %   MCV 89.3 80.0 - 100.0 fL   MCH 31.1 26.0 - 34.0 pg   MCHC 34.8 30.0 - 36.0 g/dL   RDW 12.3 11.5 - 15.5 %   Platelets 246 150 - 400 K/uL   nRBC 0.0 0.0 - 0.2 %   Neutrophils Relative % 86 %   Neutro Abs 9.3 (H) 1.7 - 7.7 K/uL   Lymphocytes Relative 6 %   Lymphs Abs 0.7 0.7 -  4.0 K/uL   Monocytes Relative 8 %   Monocytes Absolute 0.9 0.1 - 1.0 K/uL   Eosinophils Relative 0 %   Eosinophils Absolute 0.0 0.0 - 0.5 K/uL   Basophils Relative 0 %   Basophils Absolute 0.0 0.0 - 0.1 K/uL   Immature Granulocytes 0 %   Abs Immature Granulocytes 0.04 0.00 - 0.07 K/uL  Comprehensive metabolic panel     Status: Abnormal   Collection Time: 01/07/21  1:10 PM  Result Value Ref Range   Sodium 139 135 - 145 mmol/L   Potassium 4.0 3.5 - 5.1 mmol/L   Chloride 106 98 - 111 mmol/L   CO2 22 22 - 32 mmol/L   Glucose, Bld 130 (H) 70 - 99 mg/dL   BUN 17 6 - 20 mg/dL   Creatinine, Ser 1.32 (H) 0.61 - 1.24 mg/dL   Calcium 9.2 8.9 - 10.3 mg/dL   Total Protein 7.7 6.5 - 8.1 g/dL   Albumin 4.4 3.5 - 5.0 g/dL   AST 23 15 - 41 U/L   ALT 17 0 - 44 U/L   Alkaline Phosphatase 64 38 - 126 U/L   Total Bilirubin 1.2 0.3 - 1.2 mg/dL   GFR, Estimated >60 >60 mL/min   Anion gap 11 5 - 15   No results found for this or any previous visit (from the past 240 hour(s)). Creatinine: Recent Labs    01/04/21 1343 01/07/21 1310  CREATININE 1.08 1.32*    Impression/Assessment:  Left proximal ureteral stone  Plan:  I discussed the nature risk benefits and alternatives to left extracorporeal shockwave lithotripsy including continued stone passage, off label use of alpha blockers and ureteroscopy / stent.  Patient had a prior stent and desperately hopes to avoid it.  I will turn in a green sheet and he will stay n.p.o. Sunday night.  COVID test was sent.  If available and we are able to schedule, he would do left shockwave lithotripsy here in [facility] Monday, 01/09/2021 and if not he will call [facility] to see if he can get on their schedule for Thursday, January 12, 2021.  Festus Aloe 01/07/2021, 5:50 PM

## 2021-01-07 NOTE — H&P (View-Only) (Signed)
Consult: left ureteral stone  Requested by: Dr. Aletta Edouard   History of Present Illness: Keith Jackson is a 60 year old male with a history of a left proximal stone.  He saw Dr. Bernardo Heater 2 days ago and was not eligible for shockwave lithotripsy because of NSAID use.  Patient desires shockwave lithotripsy and knew that [facility] did it here in [place] on Mondays.  He went to [facility] to inquire about shockwave today but they said they did not do it and he found out it was done at [place] [facility] and came to this emergency room.  His pain is controlled and he has no fever.  He would like to be added on for Monday for shockwave if possible.  CT scan was done 01/04/2021 revealing the 6 mm left proximal stone.  KUB was done on 01/05/2021 and the stone is visible on KUB.  His urine has no bacteria.  Past Medical History:  Diagnosis Date  . History of dysplastic nevus 05/11/2008   left mid to upper back/moderate  . History of dysplastic nevus 06/22/2013   left mid to lateral tricep/mild  . History of kidney stones   . Kidney stone    Past Surgical History:  Procedure Laterality Date  . COLONOSCOPY WITH PROPOFOL N/A 09/02/2015   Procedure: COLONOSCOPY WITH PROPOFOL;  Surgeon: Manya Silvas, MD;  Location: [facility] ENDOSCOPY;  Service: Endoscopy;  Laterality: N/A;  . LITHOTRIPSY  2010-12    Home Medications:  (Not in a hospital admission)  Allergies: No Known Allergies  Family History  Problem Relation Age of Onset  . Nephrolithiasis Father   . Bladder Cancer Neg Hx   . Kidney cancer Neg Hx   . Prostate cancer Neg Hx    Social History:  reports that he has never smoked. He has never used smokeless tobacco. He reports current alcohol use of about 2.0 standard drinks of alcohol per week. He reports that he does not use drugs.  ROS: A complete review of systems was performed.  All systems are negative except for pertinent findings as noted. Review of Systems  Genitourinary: Positive for flank  pain.  All other systems reviewed and are negative.    Physical Exam:  Vital signs in last 24 hours: Temp:  [98.2 F (36.8 C)] 98.2 F (36.8 C) (04/16 1132) Pulse Rate:  [92-98] 92 (04/16 1714) Resp:  [16-18] 16 (04/16 1714) BP: (132-139)/(72-84) 132/84 (04/16 1714) SpO2:  [95 %-99 %] 99 % (04/16 1714) General:  Alert and oriented, No acute distress HEENT: Normocephalic, atraumatic Neck: No JVD or lymphadenopathy Cardiovascular: Regular rate and rhythm Lungs: Regular rate and effort Abdomen: Soft, nontender, nondistended, no abdominal masses Back: No CVA tenderness Extremities: No edema Neurologic: Grossly intact  Laboratory Data:  Results for orders placed or performed during the hospital encounter of 01/07/21 (from the past 24 hour(s))  Urinalysis, Routine w reflex microscopic Urine, Clean Catch     Status: Abnormal   Collection Time: 01/07/21 12:46 PM  Result Value Ref Range   Color, Urine AMBER (A) YELLOW   APPearance CLEAR CLEAR   Specific Gravity, Urine 1.023 1.005 - 1.030   pH 5.0 5.0 - 8.0   Glucose, UA NEGATIVE NEGATIVE mg/dL   Hgb urine dipstick MODERATE (A) NEGATIVE   Bilirubin Urine NEGATIVE NEGATIVE   Ketones, ur 80 (A) NEGATIVE mg/dL   Protein, ur NEGATIVE NEGATIVE mg/dL   Nitrite NEGATIVE NEGATIVE   Leukocytes,Ua NEGATIVE NEGATIVE   RBC / HPF 11-20 0 - 5 RBC/hpf  WBC, UA 0-5 0 - 5 WBC/hpf   Bacteria, UA NONE SEEN NONE SEEN   Mucus PRESENT   CBC with Differential (PNL)     Status: Abnormal   Collection Time: 01/07/21  1:10 PM  Result Value Ref Range   WBC 10.9 (H) 4.0 - 10.5 K/uL   RBC 5.34 4.22 - 5.81 MIL/uL   Hemoglobin 16.6 13.0 - 17.0 g/dL   HCT 47.7 39.0 - 52.0 %   MCV 89.3 80.0 - 100.0 fL   MCH 31.1 26.0 - 34.0 pg   MCHC 34.8 30.0 - 36.0 g/dL   RDW 12.3 11.5 - 15.5 %   Platelets 246 150 - 400 K/uL   nRBC 0.0 0.0 - 0.2 %   Neutrophils Relative % 86 %   Neutro Abs 9.3 (H) 1.7 - 7.7 K/uL   Lymphocytes Relative 6 %   Lymphs Abs 0.7 0.7 -  4.0 K/uL   Monocytes Relative 8 %   Monocytes Absolute 0.9 0.1 - 1.0 K/uL   Eosinophils Relative 0 %   Eosinophils Absolute 0.0 0.0 - 0.5 K/uL   Basophils Relative 0 %   Basophils Absolute 0.0 0.0 - 0.1 K/uL   Immature Granulocytes 0 %   Abs Immature Granulocytes 0.04 0.00 - 0.07 K/uL  Comprehensive metabolic panel     Status: Abnormal   Collection Time: 01/07/21  1:10 PM  Result Value Ref Range   Sodium 139 135 - 145 mmol/L   Potassium 4.0 3.5 - 5.1 mmol/L   Chloride 106 98 - 111 mmol/L   CO2 22 22 - 32 mmol/L   Glucose, Bld 130 (H) 70 - 99 mg/dL   BUN 17 6 - 20 mg/dL   Creatinine, Ser 1.32 (H) 0.61 - 1.24 mg/dL   Calcium 9.2 8.9 - 10.3 mg/dL   Total Protein 7.7 6.5 - 8.1 g/dL   Albumin 4.4 3.5 - 5.0 g/dL   AST 23 15 - 41 U/L   ALT 17 0 - 44 U/L   Alkaline Phosphatase 64 38 - 126 U/L   Total Bilirubin 1.2 0.3 - 1.2 mg/dL   GFR, Estimated >60 >60 mL/min   Anion gap 11 5 - 15   No results found for this or any previous visit (from the past 240 hour(s)). Creatinine: Recent Labs    01/04/21 1343 01/07/21 1310  CREATININE 1.08 1.32*    Impression/Assessment:  Left proximal ureteral stone  Plan:  I discussed the nature risk benefits and alternatives to left extracorporeal shockwave lithotripsy including continued stone passage, off label use of alpha blockers and ureteroscopy / stent.  Patient had a prior stent and desperately hopes to avoid it.  I will turn in a green sheet and he will stay n.p.o. Sunday night.  COVID test was sent.  If available and we are able to schedule, he would do left shockwave lithotripsy here in [facility] Monday, 01/09/2021 and if not he will call [facility] to see if he can get on their schedule for Thursday, January 12, 2021.  Festus Aloe 01/07/2021, 5:50 PM

## 2021-01-07 NOTE — ED Notes (Signed)
Pt wanting to know when the lithotripsy truck will be at [facility].  Explained that we do not have information on the lithotripsy truck and that is usually scheduled through a urologist and that you can't just show up for it.  Pt states he was seen at [facility] ED and followed up with a urologist and is frustrated feeling like he doesn't have answers.  States he was told the lithotripsy truck was at [facility] every Monday.  Pt not wanting vitals or triage completed until he receives the schedule for the lithotripsy.   States he is in pain and unable to care for himself.  Informed pt that we will be glad to see him and have him evaluated by our doctors but that we don't have the schedule for the lithotripsy truck and that I am not sure they still come to [facility] because most of the urology procedures are done at [facility].  Pt not wanting to be triaged and states that he is having his brother make some phone calls.

## 2021-01-07 NOTE — Discharge Instructions (Signed)
Lithotripsy  Dr. Junious Silk, [facility], will turn in a green sheet to schedule with his scheduler Coni for shock wave in [facility] Monday if available. Call office at [phone] if you haven't heard anything.  Do not eat anything after 1:00 AM Monday morning until you know if shock wave is available Monday.  If not available Monday in [place], you will need to call [facility] to see if you can get in for Thursday.    This sheet gives you information about how to care for yourself after your procedure. Your health care provider may also give you more specific instructions. If you have problems or questions, contact your health care provider. What can I expect after the procedure? After the procedure, it is common to have:  Some blood in your urine. This should only last for a few days.  Soreness in your back, sides, or upper abdomen for a few days.  Blotches or bruises on the area where the shock wave entered the skin.  Pain, discomfort, or nausea when pieces (fragments) of the kidney stone move through the tube that carries urine from the kidney to the bladder (ureter). Stone fragments may pass soon after the procedure, but they may continue to pass for up to 4-8 weeks. ? If you have severe pain or nausea, contact your health care provider. This may be caused by a large stone that was not broken up, and this may mean that you need more treatment.  Some pain or discomfort during urination.  Some pain or discomfort in the lower abdomen or (in men) at the base of the penis. Follow these instructions at home: Medicines  Take over-the-counter and prescription medicines only as told by your health care provider.  If you were prescribed an antibiotic medicine, take it as told by your health care provider. Do not stop taking the antibiotic even if you start to feel better.  Ask your health care provider if the medicine prescribed to you requires you to avoid driving or  using machinery. Eating and drinking  Drink enough fluid to keep your urine pale yellow. This helps any remaining pieces of the stone to pass. It can also help prevent new stones from forming.  Eat plenty of fresh fruits and vegetables.  Follow instructions from your health care provider about eating or drinking restrictions. You may be instructed to: ? Reduce how much salt (sodium) you eat or drink. Check ingredients and nutrition facts on packaged foods and beverages to see how much sodium they contain. ? Reduce how much meat you eat.  Eat the recommended amount of calcium for your age and gender. Ask your health care provider how much calcium you should have.      General instructions  Get plenty of rest.  Return to your normal activities as told by your health care provider. Ask your health care provider what activities are safe for you. Most people can resume normal activities 1-2 days after the procedure.  If you were given a sedative during the procedure, it can affect you for several hours. Do not drive or operate machinery until your health care provider says that it is safe.  Your health care provider may direct you to lie in a certain position (postural drainage) and tap firmly (percuss) over your kidney area to help stone fragments pass. Follow instructions as told by your health care provider.  If directed, strain all urine through the strainer that was provided by your health care provider. ? Keep  all fragments for your health care provider to see. Any stones that are found may be sent to a medical lab for examination. The stone may be as small as a grain of salt.  Keep all follow-up visits as told by your health care provider. This is important. Contact a health care provider if:  You have a fever or chills.  You have nausea that is severe or does not go away.  You have any of these urinary symptoms: ? Blood in your urine for longer than your health care provider  told you to expect. ? Urine that smells bad or unusual. ? Feeling a strong urge to urinate after emptying your bladder. ? Pain or burning with urination that does not go away. ? Urinating more often than usual and this does not go away.  You have a stent and it comes out. Get help right away if:  You have severe pain in your back, sides, or upper abdomen.  You have any of these urinary symptoms: ? Severe pain while urinating. ? More blood in your urine or having blood in your urine when you did not before. ? Passing blood clots in your urine. ? Passing only a small amount of urine or being unable to pass any urine at all.  You have severe nausea that leads to persistent vomiting.  You faint. Summary  After this procedure, it is common to have some pain, discomfort, or nausea when pieces (fragments) of the kidney stone move through the tube that carries urine from the kidney to the bladder (ureter). If this pain or nausea is severe, however, you should contact your health care provider.  Return to your normal activities as told by your health care provider. Ask your health care provider what activities are safe for you.  Drink enough fluid to keep your urine pale yellow. This helps any remaining pieces of the stone to pass, and it can help prevent new stones from forming.  If directed, strain your urine and keep all fragments for your health care provider to see. Fragments or stones may be as small as a grain of salt.  Get help right away if you have severe pain in your back, sides, or upper abdomen, or if you have severe pain while urinating. This information is not intended to replace advice given to you by your health care provider. Make sure you discuss any questions you have with your health care provider. Document Revised: 06/24/2019 Document Reviewed: 06/24/2019 Elsevier Patient Education  [place].

## 2021-01-07 NOTE — ED Notes (Signed)
Patient transported to Ultrasound 

## 2021-01-07 NOTE — ED Provider Notes (Signed)
[facility] DEPT Provider Note   CSN: [id number] Arrival date & time: 01/07/21  1110     History Chief Complaint  Patient presents with  . Flank Pain    Keith Jackson is a 60 y.o. male.  He has a history of kidney stones.  He was seen at [place] 3 days ago for left flank pain and diagnosed with a 4 x 6 mm proximal left ureteral stone.  He received Toradol fentanyl fluids and Zofran with improvement in his symptoms.  He followed up with urology Dr. Bernardo Heater.  He is scheduled for lithotripsy in 5 days.  He says the pain is intractable and he is not able to manage it at home.  He felt constipated so took a laxative that caused some diarrhea.  He is hoping to be admitted to the hospital until he can get lithotripsy on Monday.  No fevers.  Nausea no vomiting.  The history is provided by the patient.  Flank Pain This is a new problem. The current episode started more than 2 days ago. The problem occurs constantly. The problem has not changed since onset.Pertinent negatives include no chest pain, no abdominal pain, no headaches and no shortness of breath. Nothing aggravates the symptoms. Nothing relieves the symptoms. He has tried nothing for the symptoms. The treatment provided no relief.       Past Medical History:  Diagnosis Date  . History of dysplastic nevus 05/11/2008   left mid to upper back/moderate  . History of dysplastic nevus 06/22/2013   left mid to lateral tricep/mild  . History of kidney stones   . Kidney stone     Patient Active Problem List   Diagnosis Date Noted  . 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 Acute left flank pain 01/31/2017  . Kidney stone 01/31/2017  . Hypokalemia 01/31/2017  . Hyperglycemia 01/31/2017  . Encounter for work capability assessment 01/10/2017  . Acne rosacea 05/11/2016  . Vitamin D deficiency 12/02/2012  . Vitamin B12 deficiency (non anemic) 12/02/2012  . History of urinary stone 12/02/2012  . Pain in testicle  11/03/2012  . 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 Benign prostate hyperplasia 11/03/2012    Past Surgical History:  Procedure Laterality Date  . COLONOSCOPY WITH PROPOFOL N/A 09/02/2015   Procedure: COLONOSCOPY WITH PROPOFOL;  Surgeon: Manya Silvas, MD;  Location: [facility] ENDOSCOPY;  Service: Endoscopy;  Laterality: N/A;  . LITHOTRIPSY  2010-12       Family History  Problem Relation Age of Onset  . Nephrolithiasis Father   . Bladder Cancer Neg Hx   . Kidney cancer Neg Hx   . Prostate cancer Neg Hx     Social History   Tobacco Use  . Smoking status: Never Smoker  . Smokeless tobacco: Never Used  Vaping Use  . Vaping Use: Never used  Substance Use Topics  . Alcohol use: Yes    Alcohol/week: 2.0 standard drinks    Types: 2 Cans of beer per week    Comment: 2 times a week  . Drug use: No    Home Medications Prior to Admission medications   Medication Sig Start Date End Date Taking? Authorizing Provider  aspirin 81 MG EC tablet Take by mouth.    [provider]  doxycycline (ADOXA) 50 MG tablet Take 1 tablet (50 mg total) by mouth daily. 10/24/20   Brendolyn Patty, MD  naproxen (NAPROSYN) 500 MG tablet Take 1 tablet (500 mg total) by mouth 2 (two) times daily with  a meal. 01/04/21   Lavonia Drafts, MD  ondansetron (ZOFRAN ODT) 4 MG disintegrating tablet Take 1 tablet (4 mg total) by mouth every 8 (eight) hours as needed. 01/04/21   Lavonia Drafts, MD  oxyCODONE-acetaminophen (PERCOCET) 5-325 MG tablet Take 1 tablet by mouth every 4 (four) hours as needed for severe pain. 01/04/21 01/04/22  Fisher, Linden Dolin, PA-C  sildenafil (REVATIO) 20 MG tablet 2-5 tabs 1 hour prior to intercourse 02/16/20   Stoioff, Ronda Fairly, MD  tamsulosin (FLOMAX) 0.4 MG CAPS capsule Take 1 capsule (0.4 mg total) by mouth daily. 01/04/21   Versie Starks, PA-C    Allergies    Patient has no known allergies.  Review of Systems   Review of Systems  Constitutional: Negative for fever.  HENT:  Negative for sore throat.   Eyes: Negative for visual disturbance.  Respiratory: Negative for shortness of breath.   Cardiovascular: Negative for chest pain.  Gastrointestinal: Positive for constipation, diarrhea and nausea. Negative for abdominal pain.  Genitourinary: Positive for flank pain. Negative for dysuria.  Musculoskeletal: Positive for back pain.  Skin: Negative for rash.  Neurological: Negative for headaches.    Physical Exam Updated Vital Signs BP 139/81 (BP Location: Left Arm)   Pulse 98   Temp 98.2 F (36.8 C) (Oral)   Resp 18   SpO2 95%   Physical Exam Vitals and nursing note reviewed.  Constitutional:      Appearance: Normal appearance. He is well-developed.  HENT:     Head: Normocephalic and atraumatic.  Eyes:     Conjunctiva/sclera: Conjunctivae normal.  Cardiovascular:     Rate and Rhythm: Normal rate and regular rhythm.     Heart sounds: No murmur heard.   Pulmonary:     Effort: Pulmonary effort is normal. No respiratory distress.     Breath sounds: Normal breath sounds.  Abdominal:     Palpations: Abdomen is soft.     Tenderness: There is no abdominal tenderness.  Musculoskeletal:        General: No deformity or signs of injury. Normal range of motion.     Cervical back: Neck supple.  Skin:    General: Skin is warm and dry.     Capillary Refill: Capillary refill takes less than 2 seconds.  Neurological:     General: No focal deficit present.     Mental Status: He is alert.     ED Results / Procedures / Treatments   Labs (all labs ordered are listed, but only abnormal results are displayed) Labs Reviewed  URINALYSIS, ROUTINE W REFLEX MICROSCOPIC - Abnormal; Notable for the following components:      Result Value   Color, Urine AMBER (*)    Hgb urine dipstick MODERATE (*)    Ketones, ur 80 (*)    All other components within normal limits  CBC WITH DIFFERENTIAL/PLATELET - Abnormal; Notable for the following components:   WBC 10.9 (*)     Neutro Abs 9.3 (*)    All other components within normal limits  COMPREHENSIVE METABOLIC PANEL - Abnormal; Notable for the following components:   Glucose, Bld 130 (*)    Creatinine, Ser 1.32 (*)    All other components within normal limits  SARS CORONAVIRUS 2 (TAT 6-24 HRS)    EKG None  Radiology Abdomen 1 view (KUB)  Result Date: 01/06/2021 CLINICAL DATA:  Patient reports history of left-sided kidney stone. Some pain. EXAM: ABDOMEN - 1 VIEW COMPARISON:  CT, 01/04/2021. FINDINGS: 4 mm stone  projects to the left of the lower endplate of L3, reflecting the proximal left ureteral stone noted on the recent prior CT, unchanged in position. No visualized intrarenal stone and no other evidence of a ureteral stone. Soft tissues otherwise unremarkable. Normal bowel gas pattern. Normal skeletal structures and clear lung bases. IMPRESSION: 1. No change in the position of the 4 mm stone in the proximal left ureter. Electronically Signed   By: Lajean Manes M.D.   On: 01/06/2021 15:49   US Renal  Result Date: 01/07/2021 CLINICAL DATA:  LEFT flank pain for 1 week. EXAM: RENAL / URINARY TRACT ULTRASOUND COMPLETE COMPARISON:  None. FINDINGS: Right Kidney: Renal measurements: 11.6 x 4.6 x 4.9 cm = volume: 136 mL. Cortical echogenicity is within normal limits. RIGHT renal cyst measures 1.5 cm. No hydronephrosis. Left Kidney: Renal measurements: 12.1 x 5.9 x 5.1 cm = volume: 190 mL. Cortical echogenicity is within normal limits. Multiple LEFT renal cysts, largest measuring 2.4 cm. No hydronephrosis. Bladder: Appears normal for degree of bladder distention. Other: None. IMPRESSION: 1. No acute findings.  No hydronephrosis. 2. Bilateral renal cysts. Electronically Signed   By: Franki Cabot M.D.   On: 01/07/2021 14:01    Procedures Procedures   Medications Ordered in ED Medications  fentaNYL (SUBLIMAZE) injection 100 mcg (100 mcg Intravenous Given 01/07/21 1455)  ondansetron (ZOFRAN) injection 4 mg (4 mg  Intravenous Given 01/07/21 1455)  sodium chloride 0.9 % bolus 1,000 mL (0 mLs Intravenous Stopped 01/07/21 1752)  HYDROmorphone (DILAUDID) injection 1 mg (1 mg Intravenous Given 01/07/21 1545)    ED Course  I have reviewed the triage vital signs and the nursing notes.  Pertinent labs & imaging results that were available during my care of the patient were reviewed by me and considered in my medical decision making (see chart for details).  Clinical Course as of 01/08/21 1749  Sat Jan 07, 2021  1531 Patient's pain was briefly improved but he says his back.  He is now agreeable to some Dilaudid. [MB]  4496 Discussed with Dr. Junious Silk urology.  He did not see an indication for admission currently and felt that the patient should keep his appointment on Thursday for lithotripsy. [MB]  7591 Patient was seen by Dr. Junious Silk.  He is attempting to get him into the lithotripsy on Monday.  We will change his pain medication to Dilaudid.  Patient is comfortable plan for discharge.  Return instructions discussed [MB]    Clinical Course User Index [MB] Hayden Rasmussen, MD   MDM Rules/Calculators/A&P                         This patient complains of left flank pain; this involves an extensive number of treatment Options and is a complaint that carries with it a high risk of complications and Morbidity. The differential includes renal colic, AKI, infected stone  I ordered, reviewed and interpreted labs, which included CBC with mildly elevated white count, normal hemoglobin, chemistries normal other than slight bump in creatinine, urinalysis consistent with renal stone no infection, COVID test negative I ordered medication IV fluids IV pain medication and nausea medication I ordered imaging studies which included renal ultrasound and I independently    visualized and interpreted imaging which showed no acute findings Previous records obtained and reviewed in epic including recent ED visits to [place] I  consulted Dr. Junious Silk urology and discussed lab and imaging findings  After the interventions stated above, I reevaluated  the patient and found patient's pain to be adequately controlled.  He is well-hydrated.  He is comfortable plan for outpatient follow-up with urology for possible lithotripsy on Monday.  Return instructions discussed   Final Clinical Impression(s) / ED Diagnoses Final diagnoses:  Flank pain  Ureteral colic    Rx / DC Orders ED Discharge Orders         Ordered    HYDROmorphone (DILAUDID) 4 MG tablet  Every 4 hours PRN        01/07/21 1800           Hayden Rasmussen, MD 01/08/21 0930

## 2021-01-07 NOTE — ED Triage Notes (Signed)
Left sided flank pain with a known 1mm Kidney stone, patient states he is here to hopefully get admitted and "get lithotripsy".

## 2021-01-08 LAB — SARS CORONAVIRUS 2 (TAT 6-24 HRS): SARS Coronavirus 2: NEGATIVE

## 2021-01-09 ENCOUNTER — Other Ambulatory Visit: Payer: Self-pay | Admitting: *Deleted

## 2021-01-09 ENCOUNTER — Encounter (HOSPITAL_BASED_OUTPATIENT_CLINIC_OR_DEPARTMENT_OTHER)
Admission: RE | Disposition: A | Discharge status: Home / Self Care | Payer: Self-pay | Source: Ambulatory Visit | Attending: Urology

## 2021-01-09 ENCOUNTER — Other Ambulatory Visit: Payer: Self-pay | Admitting: Urology

## 2021-01-09 ENCOUNTER — Ambulatory Visit (HOSPITAL_COMMUNITY): Payer: Managed Care, Other (non HMO)

## 2021-01-09 ENCOUNTER — Encounter (HOSPITAL_BASED_OUTPATIENT_CLINIC_OR_DEPARTMENT_OTHER): Payer: Self-pay | Admitting: Urology

## 2021-01-09 ENCOUNTER — Ambulatory Visit (HOSPITAL_BASED_OUTPATIENT_CLINIC_OR_DEPARTMENT_OTHER)
Admission: RE | Admit: 2021-01-09 | Discharge: 2021-01-09 | Disposition: A | Discharge status: Home / Self Care | Payer: Managed Care, Other (non HMO) | Source: Ambulatory Visit | Attending: Urology | Admitting: Urology

## 2021-01-09 ENCOUNTER — Telehealth: Payer: Self-pay | Admitting: Urology

## 2021-01-09 DIAGNOSIS — N201 Calculus of ureter: Secondary | ICD-10-CM | POA: Diagnosis not present

## 2021-01-09 DIAGNOSIS — Z79899 Other long term (current) drug therapy: Secondary | ICD-10-CM | POA: Insufficient documentation

## 2021-01-09 HISTORY — PX: EXTRACORPOREAL SHOCK WAVE LITHOTRIPSY: SHX1557

## 2021-01-09 LAB — URINALYSIS, COMPLETE
Bilirubin, UA: NEGATIVE
Glucose, UA: NEGATIVE
Leukocytes,UA: NEGATIVE
Nitrite, UA: NEGATIVE
Specific Gravity, UA: 1.025 (ref 1.005–1.030)
Urobilinogen, Ur: 0.2 mg/dL (ref 0.2–1.0)
pH, UA: 6 (ref 5.0–7.5)

## 2021-01-09 LAB — MICROSCOPIC EXAMINATION: Epithelial Cells (non renal): NONE SEEN /hpf (ref 0–10)

## 2021-01-09 SURGERY — LITHOTRIPSY, ESWL
Anesthesia: LOCAL | Laterality: Left

## 2021-01-09 MED ORDER — DIPHENHYDRAMINE HCL 25 MG PO CAPS
ORAL_CAPSULE | ORAL | Status: AC
  Filled 2021-01-09: qty 1

## 2021-01-09 MED ORDER — DIAZEPAM 5 MG PO TABS
ORAL_TABLET | ORAL | Status: AC
  Filled 2021-01-09: qty 2

## 2021-01-09 MED ORDER — SODIUM CHLORIDE 0.9% FLUSH: 3.0000 mL | Freq: Two times a day (BID) | INTRAVENOUS | Status: DC

## 2021-01-09 MED ORDER — CIPROFLOXACIN HCL 500 MG PO TABS
ORAL_TABLET | ORAL | Status: AC
  Filled 2021-01-09: qty 1

## 2021-01-09 MED ORDER — DIPHENHYDRAMINE HCL 25 MG PO CAPS
25.0000 mg | ORAL_CAPSULE | ORAL | Status: AC
  Administered 2021-01-09: 25 mg via ORAL

## 2021-01-09 MED ORDER — DIAZEPAM 5 MG PO TABS: 10.0000 mg | ORAL_TABLET | Freq: Once | ORAL | Status: DC

## 2021-01-09 MED ORDER — CIPROFLOXACIN HCL 500 MG PO TABS: 500.0000 mg | ORAL_TABLET | Freq: Once | ORAL | Status: DC

## 2021-01-09 MED ORDER — SODIUM CHLORIDE 0.9 % IV SOLN: INTRAVENOUS | Status: DC

## 2021-01-09 MED ORDER — CIPROFLOXACIN HCL 500 MG PO TABS
500.0000 mg | ORAL_TABLET | ORAL | Status: AC
  Administered 2021-01-09: 500 mg via ORAL

## 2021-01-09 MED ORDER — DIAZEPAM 5 MG PO TABS
10.0000 mg | ORAL_TABLET | ORAL | Status: AC
  Administered 2021-01-09: 10 mg via ORAL

## 2021-01-09 MED ORDER — DIPHENHYDRAMINE HCL 25 MG PO CAPS: 25.0000 mg | ORAL_CAPSULE | Freq: Once | ORAL | Status: DC

## 2021-01-09 NOTE — Progress Notes (Signed)
kub

## 2021-01-09 NOTE — Telephone Encounter (Signed)
-----   Message from Irine Seal, MD sent at 01/09/2021 10:38 AM EDT ----- This is a patient of Dr. Bernardo Heater who came over the [facility] to expedite lithotripsy.  He was treated today and will need f/u with a KUB in 2-3 weeks.

## 2021-01-09 NOTE — Discharge Instructions (Signed)

## 2021-01-09 NOTE — Interval H&P Note (Signed)
History and Physical Interval Note: No change in stone location.   01/09/2021 11:28 AM  Keith Jackson  has presented today for surgery, with the diagnosis of LEFT PROXIMAL URETER.  The various methods of treatment have been discussed with the patient and family. After consideration of risks, benefits and other options for treatment, the patient has consented to  Procedure(s): EXTRACORPOREAL SHOCK WAVE LITHOTRIPSY (ESWL) (Left) as a surgical intervention.  The patient's history has been reviewed, patient examined, no change in status, stable for surgery.  I have reviewed the patient's chart and labs.  Questions were answered to the patient's satisfaction.     Irine Seal

## 2021-01-09 NOTE — Telephone Encounter (Signed)
App made will call patient with app  Keith Jackson

## 2021-01-10 ENCOUNTER — Encounter: Payer: Managed Care, Other (non HMO) | Admitting: Dermatology

## 2021-01-10 ENCOUNTER — Encounter (HOSPITAL_BASED_OUTPATIENT_CLINIC_OR_DEPARTMENT_OTHER): Payer: Self-pay | Admitting: Urology

## 2021-01-25 ENCOUNTER — Other Ambulatory Visit: Payer: Self-pay

## 2021-01-25 ENCOUNTER — Ambulatory Visit
Admission: RE | Admit: 2021-01-25 | Discharge: 2021-01-25 | Disposition: A | Discharge status: Home / Self Care | Payer: Managed Care, Other (non HMO) | Source: Ambulatory Visit | Attending: Urology | Admitting: Urology

## 2021-01-25 ENCOUNTER — Ambulatory Visit (INDEPENDENT_AMBULATORY_CARE_PROVIDER_SITE_OTHER): Payer: Managed Care, Other (non HMO) | Admitting: Physician Assistant

## 2021-01-25 ENCOUNTER — Encounter: Payer: Self-pay | Admitting: Physician Assistant

## 2021-01-25 VITALS — BP 118/74 | HR 83 | Ht 69.0 in | Wt 153.0 lb

## 2021-01-25 DIAGNOSIS — N201 Calculus of ureter: Secondary | ICD-10-CM

## 2021-01-25 LAB — URINALYSIS, COMPLETE
Bilirubin, UA: NEGATIVE
Glucose, UA: NEGATIVE
Leukocytes,UA: NEGATIVE
Nitrite, UA: NEGATIVE
Protein,UA: NEGATIVE
Specific Gravity, UA: 1.02 (ref 1.005–1.030)
Urobilinogen, Ur: 0.2 mg/dL (ref 0.2–1.0)
pH, UA: 5 (ref 5.0–7.5)

## 2021-01-25 LAB — MICROSCOPIC EXAMINATION
Bacteria, UA: NONE SEEN
Epithelial Cells (non renal): NONE SEEN /hpf (ref 0–10)

## 2021-01-25 NOTE — Progress Notes (Signed)
01/25/2021 9:17 AM   Keith Jackson 1961/08/16 [id number]  CC: Chief Complaint  Patient presents with  . Nephrolithiasis    HPI: Keith Jackson is a 60 y.o. male with PMH nephrolithiasis having previously undergone definitive stone management who underwent ESWL with Dr. Jeffie Pollock in [place] 16 days ago for management of a 5 mm proximal left ureteral stone who presents today for follow-up.  Operative report notable for fragmentation of the stone under fluoroscopy.  Today he reports having passed multiple stone fragments, which he brings with him to clinic today.  His pain has resolved.  He has no acute concerns today and states he was out of work for 1 week due to his recent stone episode.  KUB today with interval clearance of the proximal left ureteral stone.   In-office UA today positive for trace ketones and trace intact blood; urine microscopy pan negative.  PMH: Past Medical History:  Diagnosis Date  . History of dysplastic nevus 05/11/2008   left mid to upper back/moderate  . History of dysplastic nevus 06/22/2013   left mid to lateral tricep/mild  . History of kidney stones   . Kidney stone     Surgical History: Past Surgical History:  Procedure Laterality Date  . COLONOSCOPY WITH PROPOFOL N/A 09/02/2015   Procedure: COLONOSCOPY WITH PROPOFOL;  Surgeon: Manya Silvas, MD;  Location: [facility] ENDOSCOPY;  Service: Endoscopy;  Laterality: N/A;  . EXTRACORPOREAL SHOCK WAVE LITHOTRIPSY Left 01/09/2021   Procedure: EXTRACORPOREAL SHOCK WAVE LITHOTRIPSY (ESWL);  Surgeon: Irine Seal, MD;  [facility]: [facility];  Service: Urology;  Laterality: Left;  . LITHOTRIPSY  2010-12    Home Medications:  Allergies as of 01/25/2021   No Known Allergies     Medication List       Accurate as of Jan 25, 2021  9:17 AM. If you have any questions, ask your nurse or doctor.        HYDROmorphone 4 MG tablet Commonly known as: Dilaudid Take 1 tablet (4 mg total) by mouth  every 4 (four) hours as needed for severe pain.   ipratropium 0.06 % nasal spray Commonly known as: ATROVENT Place 2 sprays into both nostrils 3 (three) times daily as needed (allergies).   naproxen 500 MG tablet Commonly known as: Naprosyn Take 1 tablet (500 mg total) by mouth 2 (two) times daily with a meal.   ondansetron 4 MG disintegrating tablet Commonly known as: Zofran ODT Take 1 tablet (4 mg total) by mouth every 8 (eight) hours as needed.   sildenafil 20 MG tablet Commonly known as: REVATIO 2-5 tabs 1 hour prior to intercourse   tamsulosin 0.4 MG Caps capsule Commonly known as: FLOMAX Take 1 capsule (0.4 mg total) by mouth daily.       Allergies:  No Known Allergies  Family History: Family History  Problem Relation Age of Onset  . Nephrolithiasis Father   . Bladder Cancer Neg Hx   . Kidney cancer Neg Hx   . Prostate cancer Neg Hx     Social History:   reports that he has never smoked. He has never used smokeless tobacco. He reports current alcohol use of about 2.0 standard drinks of alcohol per week. He reports that he does not use drugs.  Physical Exam: BP 118/74   Pulse 83   Ht 5\' 9"  (1.753 m)   Wt 153 lb (69.4 kg)   BMI 22.59 kg/m   Constitutional:  Alert and oriented, no acute distress, nontoxic appearing HEENT:  [place], AT Cardiovascular: No clubbing, cyanosis, or edema Respiratory: Normal respiratory effort, no increased work of breathing Skin: No rashes, bruises or suspicious lesions Neurologic: Grossly intact, no focal deficits, moving all 4 extremities Psychiatric: Normal mood and affect  Laboratory Data: Results for orders placed or performed in visit on 01/25/21  Microscopic Examination   Urine  Result Value Ref Range   WBC, UA 0-5 0 - 5 /hpf   RBC 0-2 0 - 2 /hpf   Epithelial Cells (non renal) None seen 0 - 10 /hpf   Mucus, UA Present Not Estab.   Bacteria, UA None seen None seen/Few  Urinalysis, Complete  Result Value Ref Range    Specific Gravity, UA 1.020 1.005 - 1.030   pH, UA 5.0 5.0 - 7.5   Color, UA Yellow Yellow   Appearance Ur Hazy (A) Clear   Leukocytes,UA Negative Negative   Protein,UA Negative Negative/Trace   Glucose, UA Negative Negative   Ketones, UA Trace (A) Negative   RBC, UA Trace (A) Negative   Bilirubin, UA Negative Negative   Urobilinogen, Ur 0.2 0.2 - 1.0 mg/dL   Nitrite, UA Negative Negative   Microscopic Examination See below:    Pertinent Imaging: KUB, 01/25/2021: CLINICAL DATA:  History of left-sided kidney stone  EXAM: ABDOMEN - 1 VIEW  COMPARISON:  01/09/2021.  CT abdomen 01/04/2021  FINDINGS: Previously noted calculus in the left mid ureter no longer visualized. This was overlying the left L3 transverse process. This could have passed or possibly be overlying the sacrum. Correlate with symptoms. No other renal calculi  Normal bowel gas pattern.  No acute skeletal abnormality.  IMPRESSION: Left ureteral calculus no longer visualized.   Electronically Signed   By: Franchot Gallo M.D.   On: 01/27/2021 09:44  I personally reviewed the images referenced above and note interval clearance of the proximal left ureteral stone.  Assessment & Plan:   1. Left ureteral calculus S/p ESWL with multiple fragments passed, resolution of pain, no persistent fragment on KUB, and UA clear today.  Okay to return to work, [employer] paperwork filled out as requested.  Patient prefers to follow-up as needed and this is reasonable. - Urinalysis, Complete - Calculi, with Photograph (to Clinical Lab)   Return if symptoms worsen or fail to improve.  Debroah Loop, PA-C  [facility] [address], [place], [place] [address] [phone]

## 2021-01-30 LAB — CALCULI, WITH PHOTOGRAPH (CLINICAL LAB)
Calcium Oxalate Dihydrate: 10 %
Calcium Oxalate Monohydrate: 90 %
Weight Calculi: 39 mg

## 2021-01-31 ENCOUNTER — Encounter: Payer: Managed Care, Other (non HMO) | Admitting: Physician Assistant

## 2022-01-22 ENCOUNTER — Other Ambulatory Visit: Payer: Self-pay | Admitting: Dermatology

## 2022-01-31 ENCOUNTER — Ambulatory Visit: Payer: Managed Care, Other (non HMO) | Admitting: Dermatology

## 2022-01-31 DIAGNOSIS — Z1283 Encounter for screening for malignant neoplasm of skin: Secondary | ICD-10-CM | POA: Diagnosis not present

## 2022-01-31 DIAGNOSIS — L603 Nail dystrophy: Secondary | ICD-10-CM

## 2022-01-31 DIAGNOSIS — L738 Other specified follicular disorders: Secondary | ICD-10-CM

## 2022-01-31 DIAGNOSIS — L814 Other melanin hyperpigmentation: Secondary | ICD-10-CM

## 2022-01-31 DIAGNOSIS — L820 Inflamed seborrheic keratosis: Secondary | ICD-10-CM | POA: Diagnosis not present

## 2022-01-31 DIAGNOSIS — L570 Actinic keratosis: Secondary | ICD-10-CM

## 2022-01-31 DIAGNOSIS — L719 Rosacea, unspecified: Secondary | ICD-10-CM | POA: Diagnosis not present

## 2022-01-31 DIAGNOSIS — L219 Seborrheic dermatitis, unspecified: Secondary | ICD-10-CM | POA: Diagnosis not present

## 2022-01-31 DIAGNOSIS — D1800 Hemangioma unspecified site: Secondary | ICD-10-CM

## 2022-01-31 DIAGNOSIS — L578 Other skin changes due to chronic exposure to nonionizing radiation: Secondary | ICD-10-CM

## 2022-01-31 DIAGNOSIS — D2371 Other benign neoplasm of skin of right lower limb, including hip: Secondary | ICD-10-CM

## 2022-01-31 DIAGNOSIS — D2372 Other benign neoplasm of skin of left lower limb, including hip: Secondary | ICD-10-CM

## 2022-01-31 DIAGNOSIS — D2272 Melanocytic nevi of left lower limb, including hip: Secondary | ICD-10-CM

## 2022-01-31 DIAGNOSIS — Z86018 Personal history of other benign neoplasm: Secondary | ICD-10-CM

## 2022-01-31 DIAGNOSIS — Z872 Personal history of diseases of the skin and subcutaneous tissue: Secondary | ICD-10-CM

## 2022-01-31 DIAGNOSIS — D229 Melanocytic nevi, unspecified: Secondary | ICD-10-CM

## 2022-01-31 MED ORDER — TAVABOROLE 5 % EX SOLN: 1.0000 "application " | Freq: Every day | CUTANEOUS | 11 refills | Status: DC

## 2022-01-31 NOTE — Progress Notes (Signed)
? ?Follow-Up Visit ?  ?Subjective  ?Keith Jackson is a 61 y.o. male who presents for the following: Annual Exam (Hx of rosacea , hx of dysplastic nevus, hx of aks at scalp , face, chest. Patient reports a spot at back of left leg, some spots at face and ears, and some problems with toenail of left great toe.). Some are itchy and irritated. ? ?The patient presents for Total-Body Skin Exam (TBSE) for skin cancer screening and mole check.  The patient has spots, moles and lesions to be evaluated, some may be new or changing and the patient has concerns that these could be cancer. ? ? ?The following portions of the chart were reviewed this encounter and updated as appropriate:   ?  ? ?Review of Systems: No other skin or systemic complaints except as noted in HPI or Assessment and Plan. ? ? ?Objective  ?Well appearing patient in no apparent distress; mood and affect are within normal limits. ? ?A full examination was performed including scalp, head, eyes, ears, nose, lips, neck, chest, axillae, abdomen, back, buttocks, bilateral upper extremities, bilateral lower extremities, hands, feet, fingers, toes, fingernails, and toenails. All findings within normal limits unless otherwise noted below. ? ?right pariatal scalp x 5, crown x 6, lt jaw x 1 , right neck x 1, left neck x 1,  rt ear x 1 (15) ?Erythematous thin papules/macules with gritty scale.  ? ?left lateral calf x 1 ?Erythematous stuck-on, waxy papule ? ?face ?Clear today  ? ?b/l ears ?Pink scaly patches ? ?left great toenail and left thumb nail ?See photos ?Distal onycholysis L thumbnail, white patch medial distal L gt toenail ? ?Pt has PO Lamisil in past and didn't help thumbnail ? ? ? ? ? ? ? ? ? ? ?Left Medial Thigh ?1 mm medium dark brown macule  ? ? ? ?Assessment & Plan  ?Actinic keratosis (15) ?right pariatal scalp x 5, crown x 6, lt jaw x 1 , right neck x 1, left neck x 1,  rt ear x 1 ? ?Discussed treatment  5-fluorouracil/calcipotriene cream  ?Patient  deferred treatment at this time. ? ?Patient hx of pdt to scalp x 2 with good results in past ? ?Actinic keratoses are precancerous spots that appear secondary to cumulative UV radiation exposure/sun exposure over time. They are chronic with expected duration over 1 year. A portion of actinic keratoses will progress to squamous cell carcinoma of the skin. It is not possible to reliably predict which spots will progress to skin cancer and so treatment is recommended to prevent development of skin cancer. ? ?Recommend daily broad spectrum sunscreen SPF 30+ to sun-exposed areas, reapply every 2 hours as needed.  ?Recommend staying in the shade or wearing long sleeves, sun glasses (UVA+UVB protection) and wide brim hats (4-inch brim around the entire circumference of the hat). ?Call for new or changing lesions. ? ?Destruction of lesion - right pariatal scalp x 5, crown x 6, lt jaw x 1 , right neck x 1, left neck x 1,  rt ear x 1 ? ?Destruction method: cryotherapy   ?Informed consent: discussed and consent obtained   ?Lesion destroyed using liquid nitrogen: Yes   ?Region frozen until ice ball extended beyond lesion: Yes   ?Outcome: patient tolerated procedure well with no complications   ?Post-procedure details: wound care instructions given   ?Additional details:  Prior to procedure, discussed risks of blister formation, small wound, skin dyspigmentation, or rare scar following cryotherapy. Recommend Vaseline ointment to  treated areas while healing. ? ? ?Inflamed seborrheic keratosis ?left lateral calf x 1 ? ?Destruction of lesion - left lateral calf x 1 ? ?Destruction method: cryotherapy   ?Informed consent: discussed and consent obtained   ?Lesion destroyed using liquid nitrogen: Yes   ?Region frozen until ice ball extended beyond lesion: Yes   ?Outcome: patient tolerated procedure well with no complications   ?Post-procedure details: wound care instructions given   ?Additional details:  Prior to procedure, discussed  risks of blister formation, small wound, skin dyspigmentation, or rare scar following cryotherapy. Recommend Vaseline ointment to treated areas while healing. ? ? ?Rosacea ?face ? ?Chronic condition with duration or expected duration over one year. Currently well-controlled. ? ?Rosacea is a chronic progressive skin condition usually affecting the face of adults, causing redness and/or acne bumps. It is treatable but not curable. It sometimes affects the eyes (ocular rosacea) as well. It may respond to topical and/or systemic medication and can flare with stress, sun exposure, alcohol, exercise and some foods.  Daily application of broad spectrum spf 30+ sunscreen to face is recommended to reduce flares. ? ? ?Continue Doxycycline 50 mg capsule qd with food prn flares.  ? ?Doxycycline should be taken with food to prevent nausea. Do not lay down for 30 minutes after taking. Be cautious with sun exposure and use good sun protection while on this medication. Pregnant women should not take this medication.  ? ? ?Seborrheic dermatitis ?b/l ears ? ?Ears ?Chronic condition with duration or expected duration over one year. Currently well-controlled.  ? ?Seborrheic Dermatitis  ?-  is a chronic persistent rash characterized by pinkness and scaling most commonly of the mid face but also can occur on the scalp (dandruff), ears; mid chest, mid back and groin.  It tends to be exacerbated by stress and cooler weather.  People who have neurologic disease may experience new onset or exacerbation of existing seborrheic dermatitis.  The condition is not curable but treatable and can be controlled. ? ?Continue Cloderm cream qd/bid prn flares, pt has ? ?Nail dystrophy ?left great toenail and left thumb nail ? ?Possible tinea unguium ? ?Start Kerydin 5 % soln - apply topically to aa's toenail and left thumb nail at bedtime. Rx sent to [facility]  ? ?Discussed weekly oral fluconazole x 7-9 mos.  Pt defers at this  time ? ? ? ?Tavaborole (KERYDIN) 5 % SOLN - left great toenail and left thumb nail ?Apply 1 application. topically at bedtime. Apply to left thumb nail and left toenail ? ?Nevus ?Left Medial Thigh ? ?Benign-appearing.  Observation.  Call clinic for new or changing lesions.  Recommend daily use of broad spectrum spf 30+ sunscreen to sun-exposed areas.  ? ? ? ?Lentigines ?- Scattered tan macules ?- Due to sun exposure ?- Benign-appearing, observe ?- Recommend daily broad spectrum sunscreen SPF 30+ to sun-exposed areas, reapply every 2 hours as needed. ?- Call for any changes ? ?Sebaceous Hyperplasia at face  ?- Small yellow papules with a central dell ?- Benign ?- Observe ? ?Seborrheic Keratoses ?- Stuck-on, waxy, tan-brown papules and/or plaques  ?- Benign-appearing ?- Discussed benign etiology and prognosis. ?- Observe ?- Call for any changes ? ?Melanocytic Nevi ?- Tan-brown and/or pink-flesh-colored symmetric macules and papules at arms  ?- Benign appearing on exam today ?- Observation ?- Call clinic for new or changing moles ?- Recommend daily use of broad spectrum spf 30+ sunscreen to sun-exposed areas.  ? ?Dermatofibroma ?- Firm pink/brown papulenodule with dimple sign  at left medial knee and right thigh  ?- Benign appearing ?- Call for any changes ? ?Hemangiomas ?- Red papules ?- Discussed benign nature ?- Observe ?- Call for any changes ? ?Actinic Damage at neck ?- Chronic condition, secondary to cumulative UV/sun exposure ?- diffuse scaly erythematous macules with underlying dyspigmentation ?- Recommend daily broad spectrum sunscreen SPF 30+ to sun-exposed areas, reapply every 2 hours as needed.  ?- Staying in the shade or wearing long sleeves, sun glasses (UVA+UVB protection) and wide brim hats (4-inch brim around the entire circumference of the hat) are also recommended for sun protection.  ?- Call for new or changing lesions. ? ?History of Dysplastic Nevi left mid to upper back/mod 2009 and left mid to lat  tricep mild 2014 ?- No evidence of recurrence today ?- Recommend regular full body skin exams ?- Recommend daily broad spectrum sunscreen SPF 30+ to sun-exposed areas, reapply every 2 hours as needed.  ?- Call if any new or c

## 2022-01-31 NOTE — Patient Instructions (Addendum)
Melanoma ABCDEs ? ?Melanoma is the most dangerous type of skin cancer, and is the leading cause of death from skin disease.  You are more likely to develop melanoma if you: ?Have light-colored skin, light-colored eyes, or red or blond hair ?Spend a lot of time in the sun ?Tan regularly, either outdoors or in a tanning bed ?Have had blistering sunburns, especially during childhood ?Have a close family member who has had a melanoma ?Have atypical moles or large birthmarks ? ?Early detection of melanoma is key since treatment is typically straightforward and cure rates are extremely high if we catch it early.  ? ?The first sign of melanoma is often a change in a mole or a new dark spot.  The ABCDE system is a way of remembering the signs of melanoma. ? ?A for asymmetry:  The two halves do not match. ?B for border:  The edges of the growth are irregular. ?C for color:  A mixture of colors are present instead of an even brown color. ?D for diameter:  Melanomas are usually (but not always) greater than 63m - the size of a pencil eraser. ?E for evolution:  The spot keeps changing in size, shape, and color. ? ?Please check your skin once per month between visits. You can use a small mirror in front and a large mirror behind you to keep an eye on the back side or your body.  ? ?If you see any new or changing lesions before your next follow-up, please call to schedule a visit. ? ?Please continue daily skin protection including broad spectrum sunscreen SPF 30+ to sun-exposed areas, reapplying every 2 hours as needed when you're outdoors.  ? ?Staying in the shade or wearing long sleeves, sun glasses (UVA+UVB protection) and wide brim hats (4-inch brim around the entire circumference of the hat) are also recommended for sun protection.   ? ? ?Rosacea ? ?What is rosacea? ?Rosacea (say: ro-zay-sha) is a common skin disease that usually begins as a trend of flushing or blushing easily.  As rosacea progresses, a persistent redness  in the center of the face will develop and may gradually spread beyond the nose and cheeks to the forehead and chin.  In some cases, the ears, chest, and back could be affected.  Rosacea may appear as tiny blood vessels or small red bumps that occur in crops.  Frequently they can contain pus, and are called ?pustules?.  If the bumps do not contain pus, they are referred to as ?papules?.  Rarely, in prolonged, untreated cases of rosacea, the oil glands of the nose and cheeks may become permanently enlarged.  This is called rhinophyma, and is seen more frequently in men. ? ?Signs and Risks ?In its beginning stages, rosacea tends to come and go, which makes it difficult to recognize.  It can start as intermittent flushing of the face.  Eventually, blood vessels may become permanently visible.  Pustules and papules can appear, but can be mistaken for adult acne.  People of all races, ages, genders and ethnic groups are at risk of developing rosacea.  However, it is more common in women (especially around menopause) and adults with fair skin between the ages of 332and 554 ? ?Treatment ?Dermatologists typically recommend a combination of treatments to effectively manage rosacea.  Treatment can improve symptoms and may stop the progression of the rosacea.  Treatment may involve both topical and oral medications.  The tetracycline antibiotics are often used for their anti-inflammatory effect; however, because  of the possibility of developing antibiotic resistance, they should not be used long term at full dose.  For dilated blood vessels the options include electrodessication (uses electric current through a small needle), laser treatment, and cosmetics to hide the redness.   ?With all forms of treatment, improvement is a slow process, and patients may not see any results for the first 3-4 weeks.  It is very important to avoid the sun and other triggers.  Patients must wear sunscreen daily. ? ?Skin Care  Instructions: ?Cleanse the skin with a mild soap such as CeraVe cleanser, Cetaphil cleanser, or Dove soap once or twice daily as needed. ?Moisturize with Eucerin Redness Relief Daily Perfecting Lotion (has a subtle green tint), CeraVe Moisturizing Cream, or Oil of Olay Daily Moisturizer with sunscreen every morning and/or night as recommended. ?Makeup should be ?non-comedogenic? (won?t clog pores) and be labeled ?for sensitive skin?Kermit Balo choices for cosmetics are: Neutrogena, Almay, and Physician?s Formula.  Any product with a green tint tends to offset a red complexion. ?If your eyes are dry and irritated, use artificial tears 2-3 times per day and cleanse the eyelids daily with baby shampoo.  Have your eyes examined at least every 2 years.  Be sure to tell your eye doctor that you have rosacea. ?Alcoholic beverages tend to cause flushing of the skin, and may make rosacea worse. ?Always wear sunscreen, protect your skin from extreme hot and cold temperatures, and avoid spicy foods, hot drinks, and mechanical irritation such as rubbing, scrubbing, or massaging the face.  Avoid harsh skin cleansers, cleansing masks, astringents, and exfoliation. If a particular product burns or makes your face feel tight, then it is likely to flare your rosacea. ?If you are having difficulty finding a sunscreen that you can tolerate, you may try switching to a chemical-free sunscreen.  These are ones whose active ingredient is zinc oxide or titanium dioxide only.  They should also be fragrance free, non-comedogenic, and labeled for sensitive skin. ?Rosacea triggers may vary from person to person.  There are a variety of foods that have been reported to trigger rosacea.  Some patients find that keeping a diary of what they were doing when they flared helps them avoid triggers. ? ?Doxycycline should be taken with food to prevent nausea. Do not lay down for 30 minutes after taking. Be cautious with sun exposure and use good sun  protection while on this medication. Pregnant women should not take this medication.  ? ? ? ?Actinic keratoses are precancerous spots that appear secondary to cumulative UV radiation exposure/sun exposure over time. They are chronic with expected duration over 1 year. A portion of actinic keratoses will progress to squamous cell carcinoma of the skin. It is not possible to reliably predict which spots will progress to skin cancer and so treatment is recommended to prevent development of skin cancer. ? ?Recommend daily broad spectrum sunscreen SPF 30+ to sun-exposed areas, reapply every 2 hours as needed.  ?Recommend staying in the shade or wearing long sleeves, sun glasses (UVA+UVB protection) and wide brim hats (4-inch brim around the entire circumference of the hat). ?Call for new or changing lesions.  ? ?Cryotherapy Aftercare ? ?Wash gently with soap and water everyday.   ?Apply Vaseline and Band-Aid daily until healed.  ? ?Seborrheic Keratosis ? ?What causes seborrheic keratoses? ?Seborrheic keratoses are harmless, common skin growths that first appear during adult life.  As time goes by, more growths appear.  Some people may develop a large number of  them.  Seborrheic keratoses appear on both covered and uncovered body parts.  They are not caused by sunlight.  The tendency to develop seborrheic keratoses can be inherited.  They vary in color from skin-colored to gray, brown, or even black.  They can be either smooth or have a rough, warty surface.   ?Seborrheic keratoses are superficial and look as if they were stuck on the skin.  Under the microscope this type of keratosis looks like layers upon layers of skin.  That is why at times the top layer may seem to fall off, but the rest of the growth remains and re-grows.   ? ?Treatment ?Seborrheic keratoses do not need to be treated, but can easily be removed in the office.  Seborrheic keratoses often cause symptoms when they rub on clothing or jewelry.  Lesions  can be in the way of shaving.  If they become inflamed, they can cause itching, soreness, or burning.  Removal of a seborrheic keratosis can be accomplished by freezing, burning, or surgery. ?If any spot bleeds, scabs, or grows ra

## 2022-02-21 ENCOUNTER — Ambulatory Visit: Payer: Managed Care, Other (non HMO) | Admitting: Dermatology

## 2022-08-01 ENCOUNTER — Other Ambulatory Visit: Payer: Self-pay | Admitting: Internal Medicine

## 2022-08-01 DIAGNOSIS — I2510 Atherosclerotic heart disease of native coronary artery without angina pectoris: Secondary | ICD-10-CM

## 2022-08-08 ENCOUNTER — Ambulatory Visit
Admission: RE | Admit: 2022-08-08 | Discharge: 2022-08-08 | Disposition: A | Discharge status: Home / Self Care | Payer: Managed Care, Other (non HMO) | Source: Ambulatory Visit | Attending: Internal Medicine | Admitting: Internal Medicine

## 2022-08-08 DIAGNOSIS — I2510 Atherosclerotic heart disease of native coronary artery without angina pectoris: Secondary | ICD-10-CM | POA: Insufficient documentation

## 2022-09-19 ENCOUNTER — Other Ambulatory Visit: Payer: Self-pay | Admitting: Dermatology

## 2023-01-10 IMAGING — CT CT RENAL STONE PROTOCOL
2 of 4 series · 15 of 46 positions shown, 17 images · non-contrast
Comparison: CT scan 02/09/2017

CLINICAL DATA: Left-sided flank pain.

EXAM:
CT ABDOMEN AND PELVIS WITHOUT CONTRAST
TECHNIQUE: Multidetector CT imaging of the abdomen and pelvis was performed
following the standard protocol without IV contrast.

[Series 2: stone full standard · axial · 0.68mm/px · z∈[-538,-88]mm · 12 of 100 slices shown, 14 images]
[im 5/100  soft-tissue]
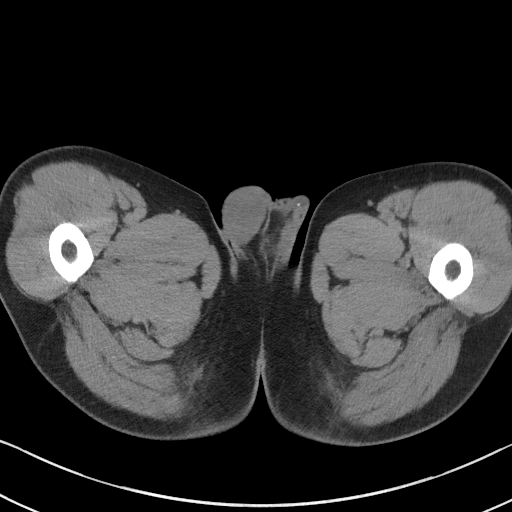
[im 5/100  bone]
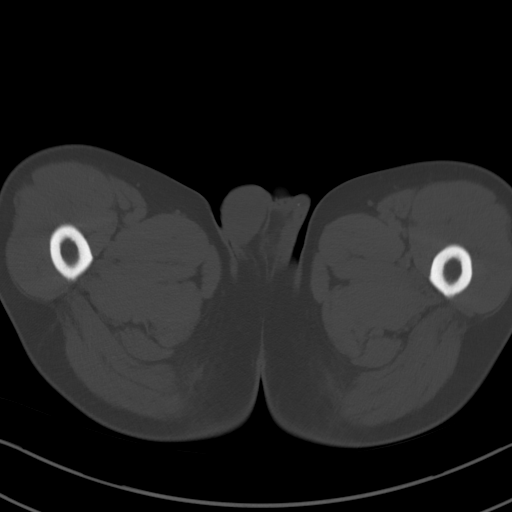
[im 13/100  soft-tissue]
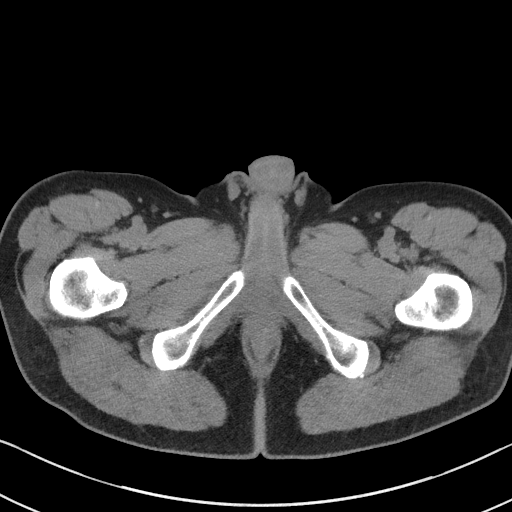
[im 21/100  soft-tissue]
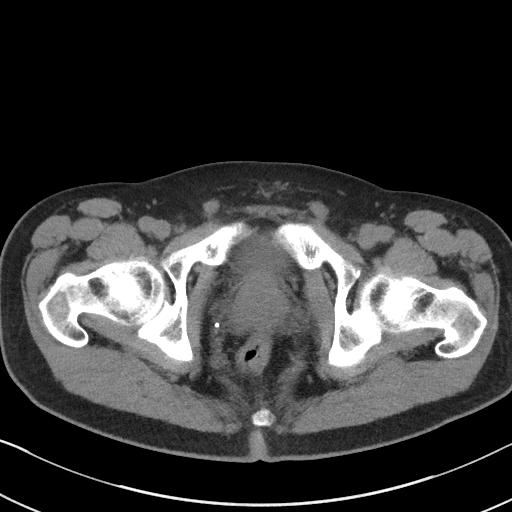
[im 29/100  soft-tissue]
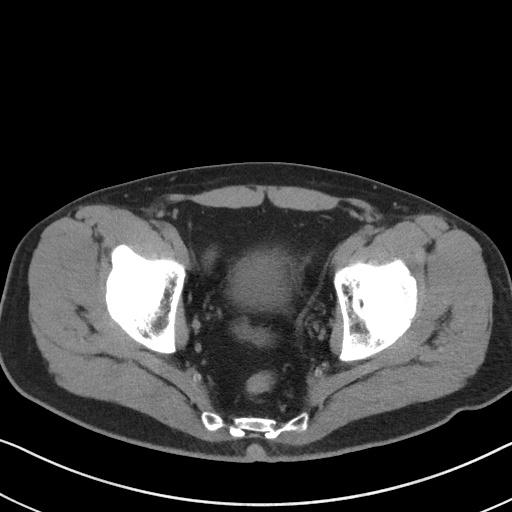
[im 38/100  soft-tissue]
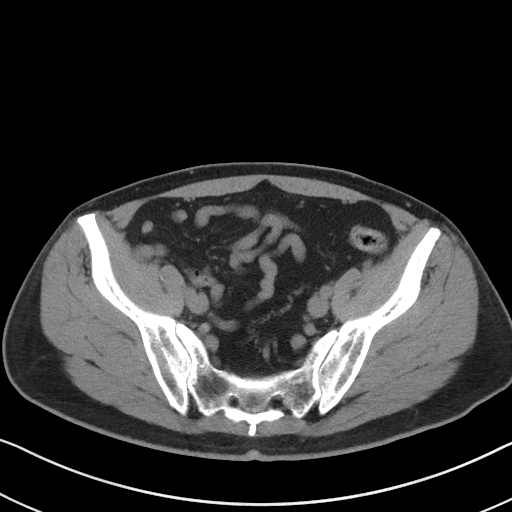
[im 46/100  soft-tissue]
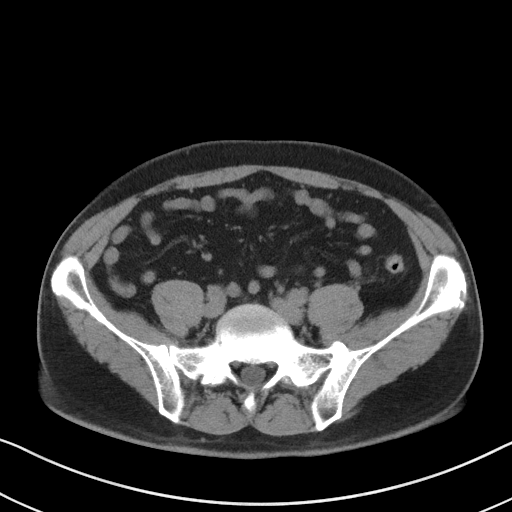
[im 54/100  soft-tissue]
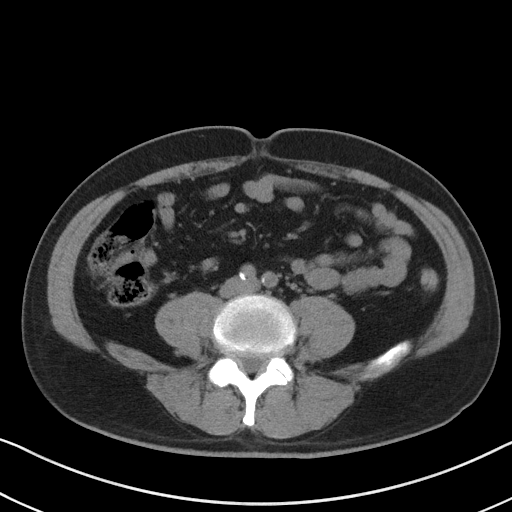
[im 62/100  soft-tissue]
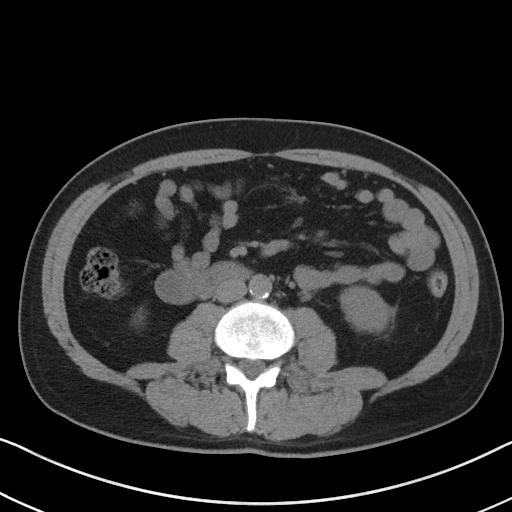
[im 71/100  soft-tissue]
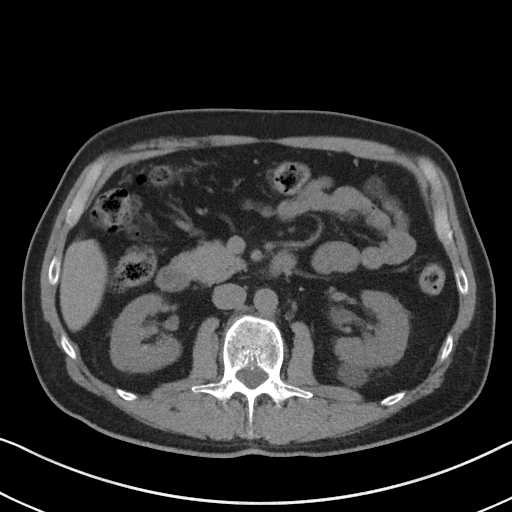
[im 71/100  bone]
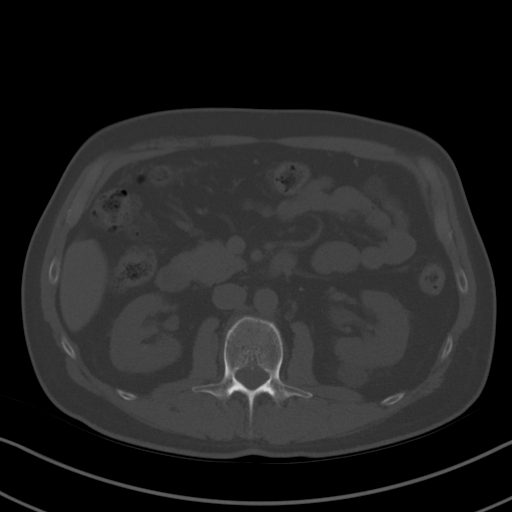
[im 79/100  soft-tissue]
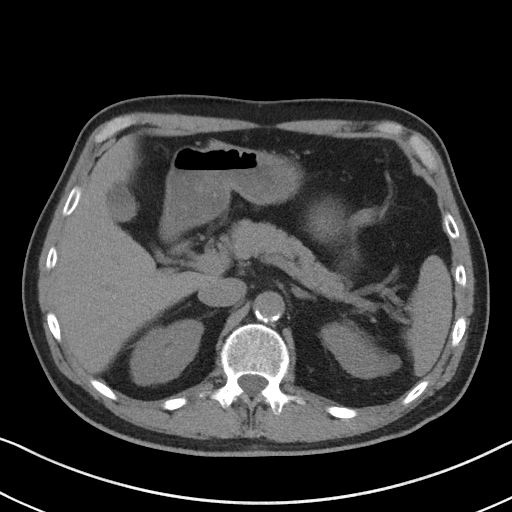
[im 87/100  soft-tissue]
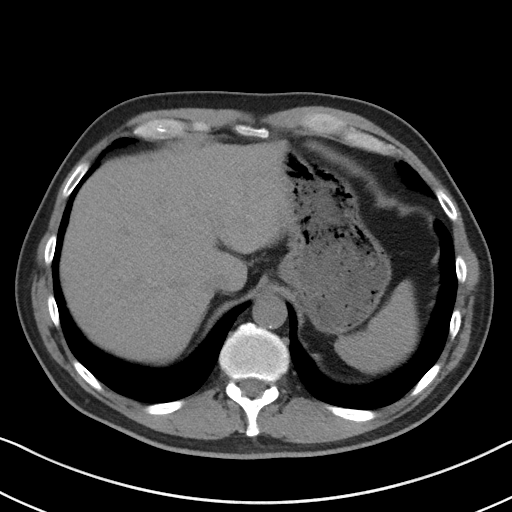
[im 95/100  soft-tissue]
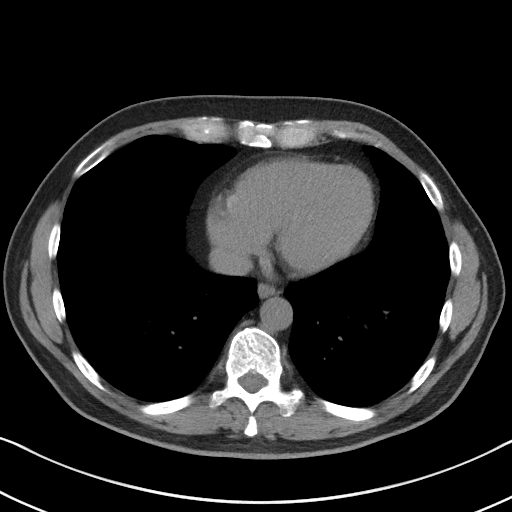

[Series 5: coronal · coronal · 0.79mm/px · 3 of 130 slices shown]
[im 44/130  soft-tissue]
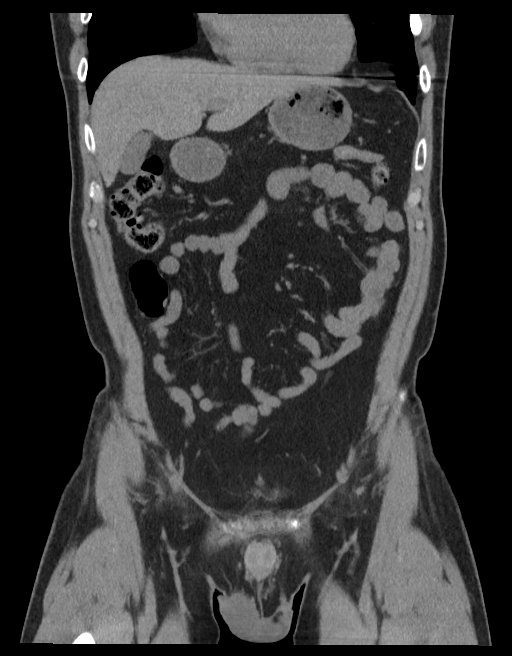
[im 58/130  soft-tissue]
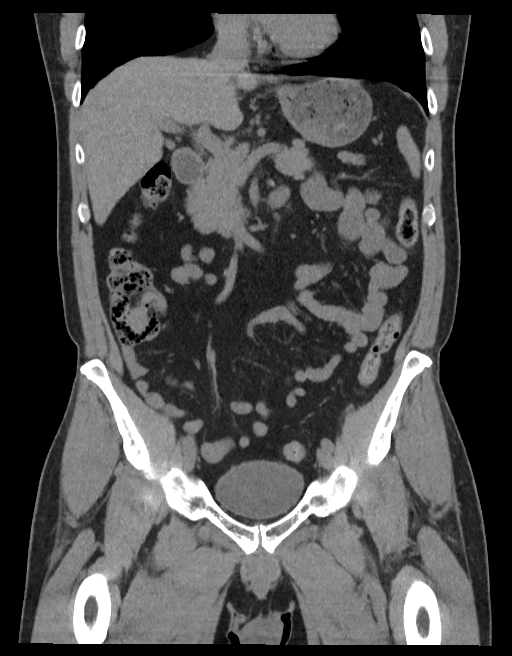
[im 72/130  soft-tissue]
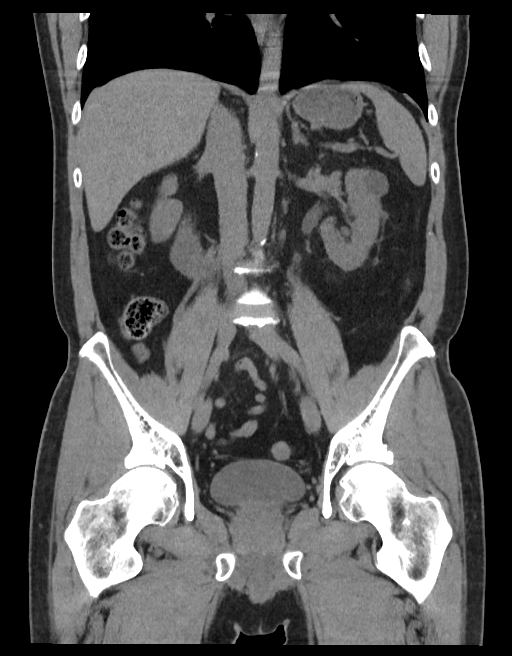

[15 of 46 positions shown; findings below may reference images not displayed]

FINDINGS: Lower chest: The lung bases are clear of acute process. No pleural
effusion or pulmonary lesions. The heart is normal in size. No
pericardial effusion. The distal esophagus and aorta are
unremarkable.

Hepatobiliary: Vague low-attenuation lesion in the left hepatic dome
present on the prior study but slightly larger. Likely benign cyst.
No worrisome hepatic lesions or intrahepatic biliary dilatation. The
gallbladder is unremarkable. No common bile duct dilatation.

Pancreas: No mass, inflammation or ductal dilatation.

Spleen: Normal size.  No focal lesions.

Adrenals/Urinary Tract: The adrenal glands are normal.

Mild left-sided hydronephrosis and upper left hydroureter due to a 6
x 4 mm calculus just below the UPJ. No distal ureteral calculi.

Small midpole right renal calculus but no obstructing right-sided
ureteral calculi. No bladder calculi or bladder mass.

Bilateral simple appearing renal cysts. No worrisome renal lesions
are identified without contrast.

Stomach/Bowel: The stomach, duodenum, small bowel and colon are
grossly normal without oral contrast. No inflammatory changes, mass
lesions or obstructive findings. The appendix is normal.

Vascular/Lymphatic: Moderate age advanced atherosclerotic
calcifications involving the abdominal aorta and iliac arteries. No
aneurysm. No mesenteric or retroperitoneal mass or adenopathy. Small
scattered lymph nodes are noted.

Reproductive: The prostate gland and seminal vesicles are
unremarkable.

Other: No pelvic mass or adenopathy. No free pelvic fluid
collections. No inguinal mass or adenopathy. No abdominal wall
hernia or subcutaneous lesions.

Musculoskeletal: No significant bony findings.
IMPRESSION: 1. 6 x 4 mm left upper ureteral calculus causing mild left-sided
hydronephrosis and upper left hydroureter.
2. Small midpole right renal calculus but no obstructing right-sided
ureteral calculi.
3. Bilateral simple appearing renal cysts.
4. No other significant abdominal/pelvic findings, mass lesions or
adenopathy.
5. Age advanced atherosclerotic calcifications involving the
abdominal aorta and iliac arteries.
6. Aortic atherosclerosis.

Aortic Atherosclerosis ([id number]).

## 2023-01-11 IMAGING — CR DG ABDOMEN 1V
1 series · 2 of 2 positions shown · non-contrast
Comparison: CT, 01/04/2021.

CLINICAL DATA: Patient reports history of left-sided kidney stone.
Some pain.

EXAM:
ABDOMEN - 1 VIEW

[Series 1: dg abd 1 view · 0.14mm/px · 2 of 2 slices shown]
[im 1/2]
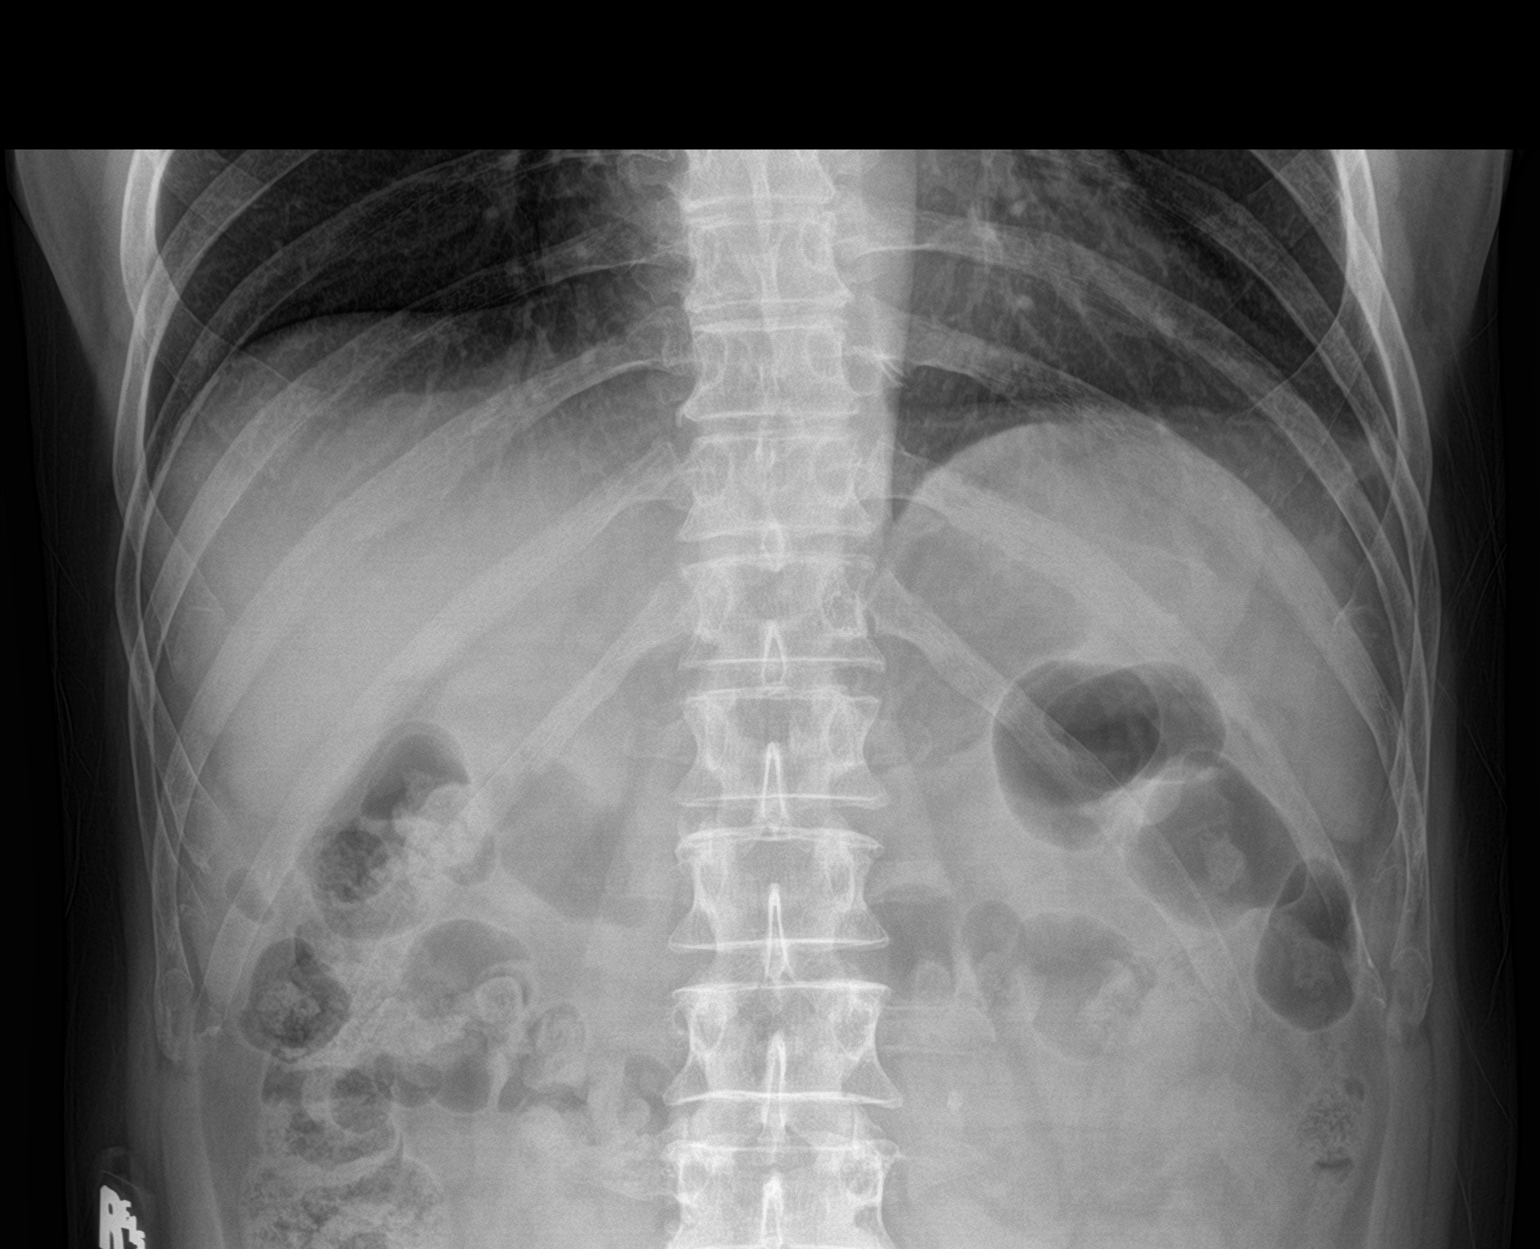
[im 2/2]
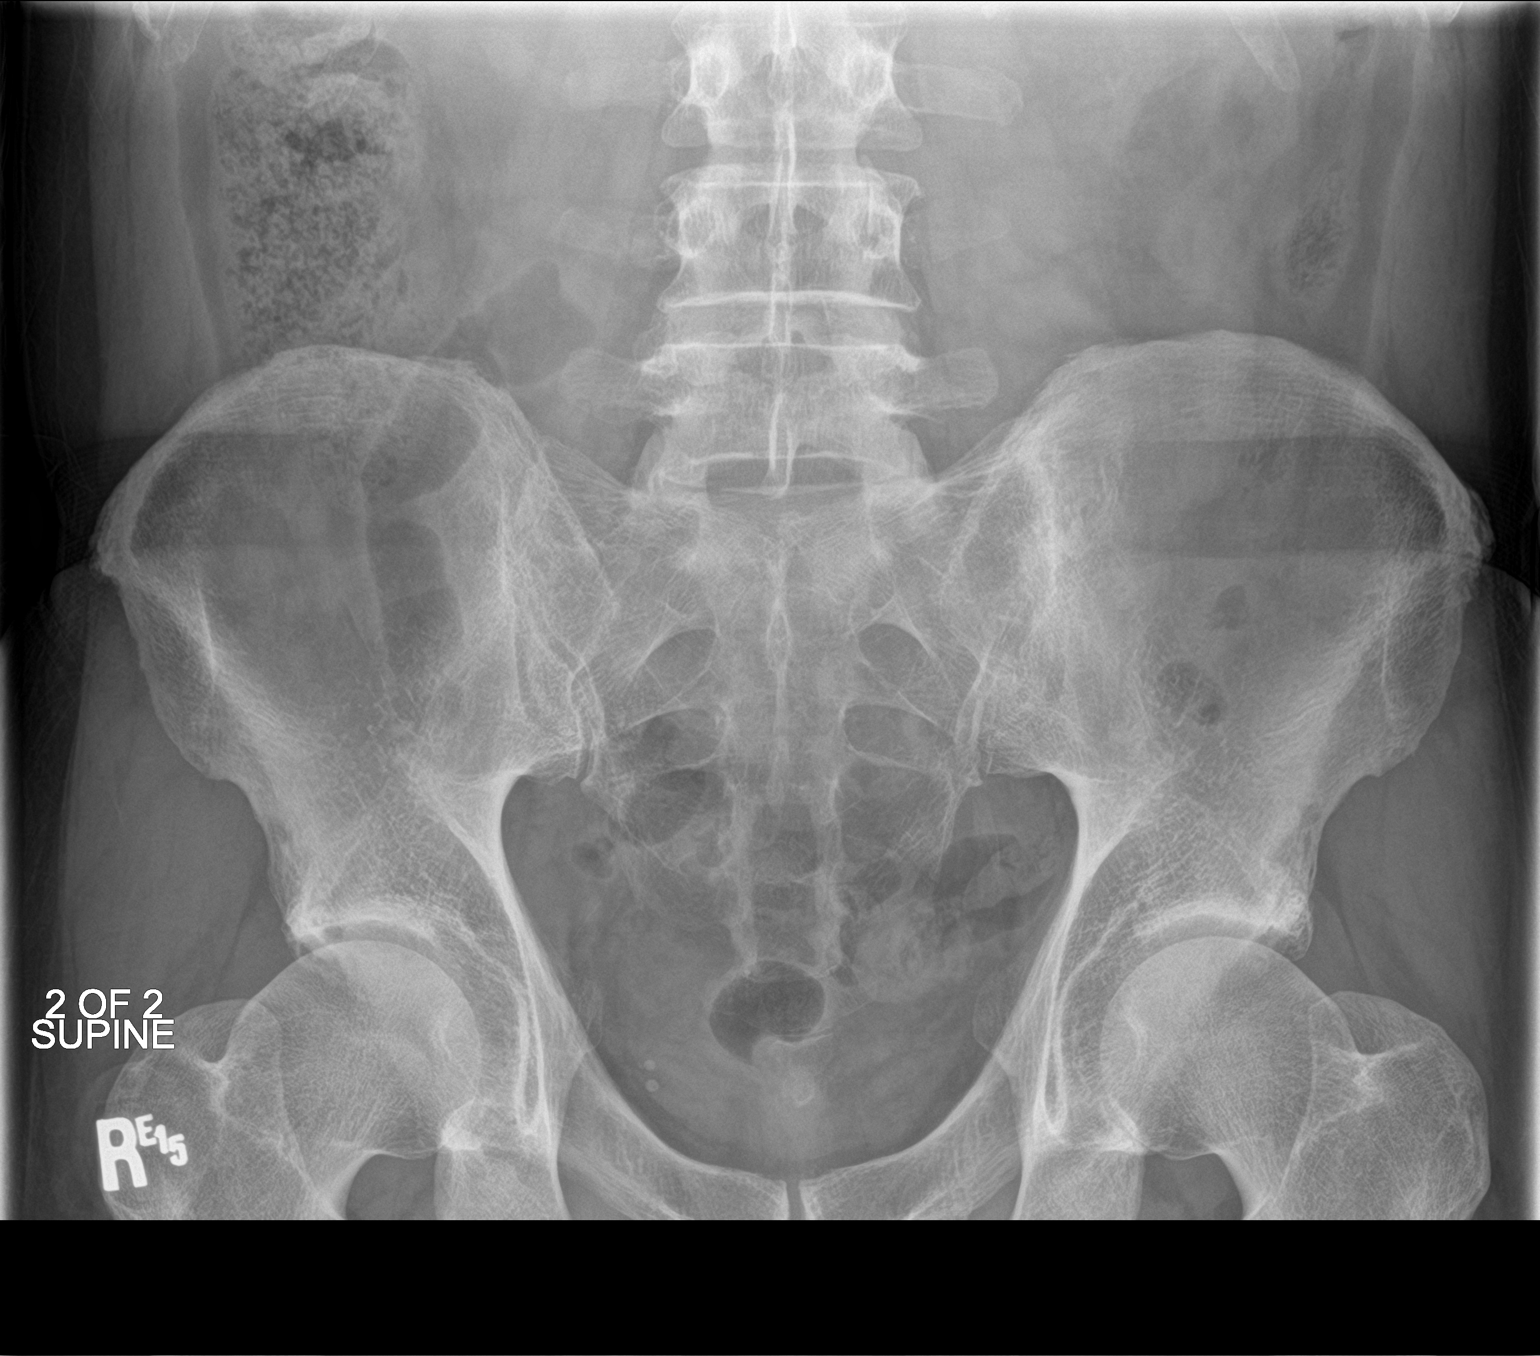

[2 of 2 positions shown; findings below may reference images not displayed]

FINDINGS: 4 mm stone projects to the left of the lower endplate of L3,
reflecting the proximal left ureteral stone noted on the recent
prior CT, unchanged in position. No visualized intrarenal stone and
no other evidence of a ureteral stone. Soft tissues otherwise
unremarkable.

Normal bowel gas pattern. Normal skeletal structures and clear lung
bases.
IMPRESSION: 1. No change in the position of the 4 mm stone in the proximal left
ureter.

## 2023-01-13 IMAGING — US US RENAL
1 series · 14 of 25 positions shown · non-contrast
Comparison: None.

CLINICAL DATA: LEFT flank pain for 1 week.

EXAM:
RENAL / URINARY TRACT ULTRASOUND COMPLETE

[Series 1: us renal · 14 of 38 slices shown]
[im 1/38]
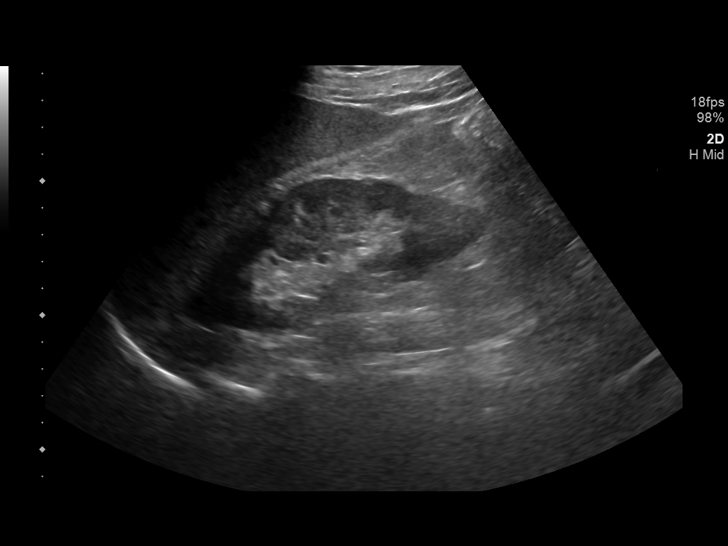
[im 4/38]
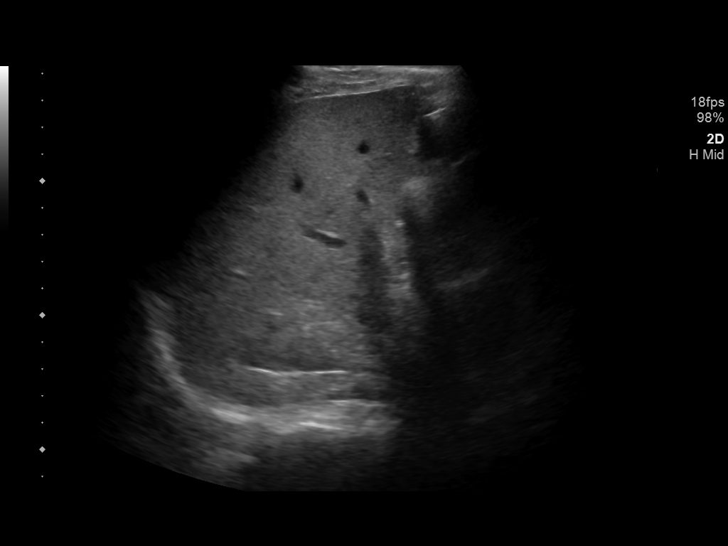
[im 7/38]
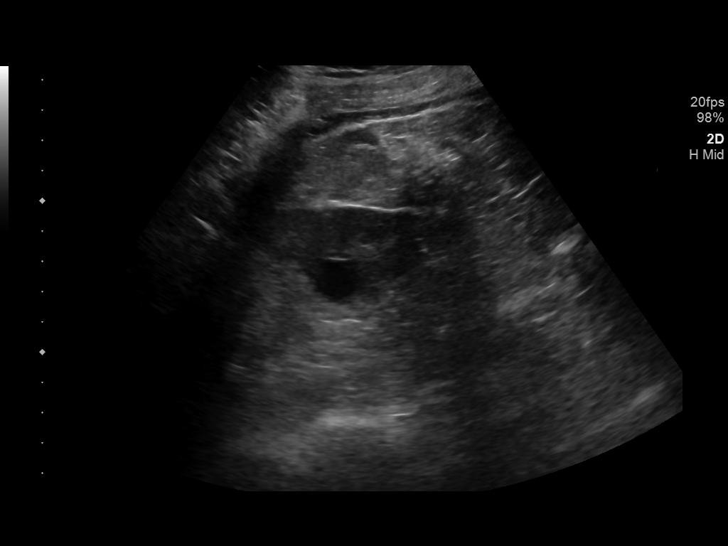
[im 10/38]
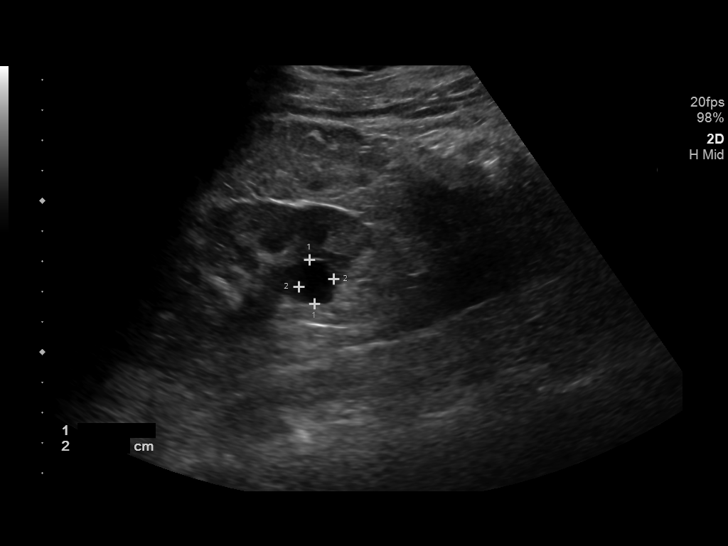
[im 13/38]
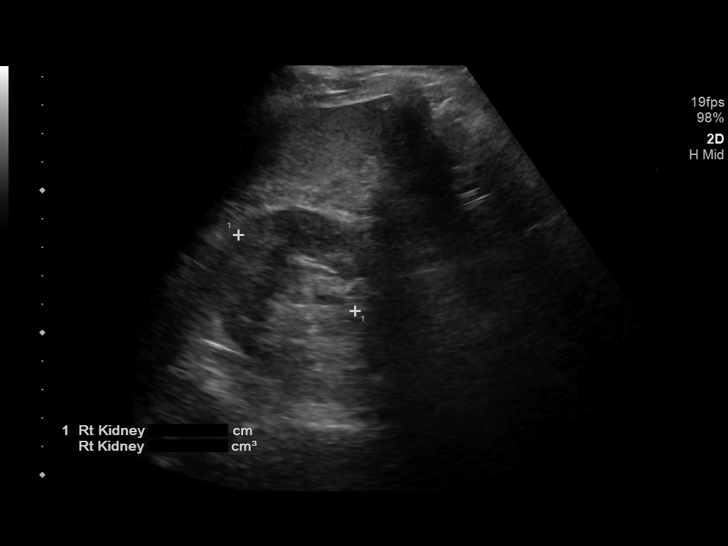
[im 14/38]
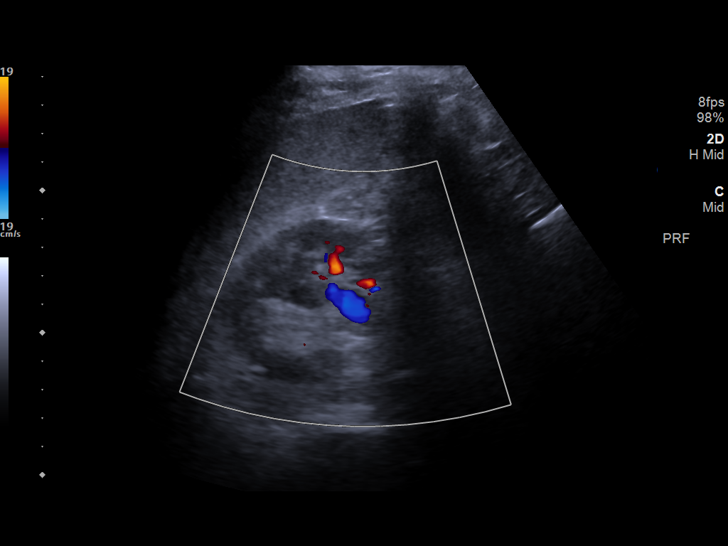
[im 17/38]
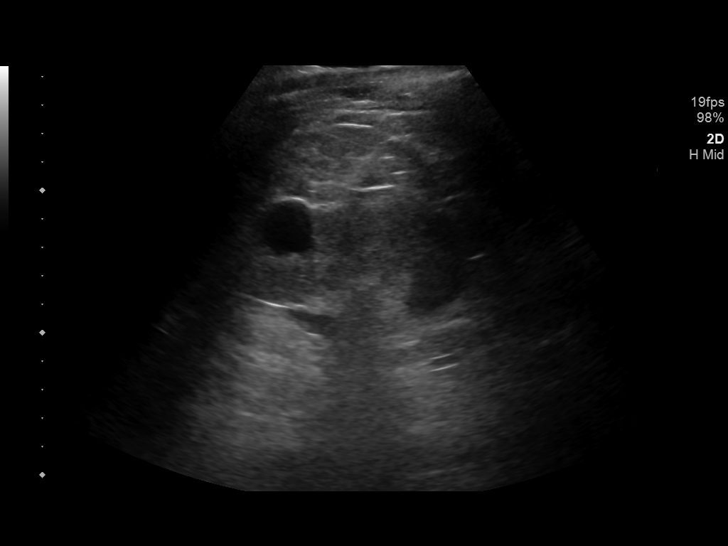
[im 21/38]
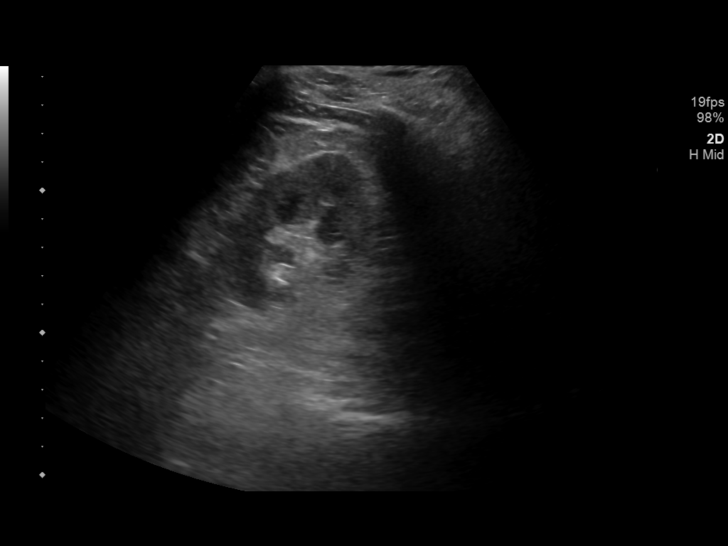
[im 24/38]
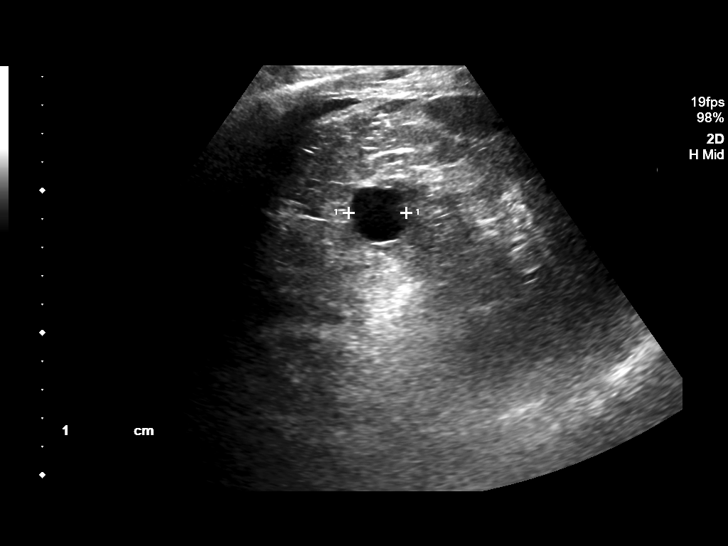
[im 25/38]
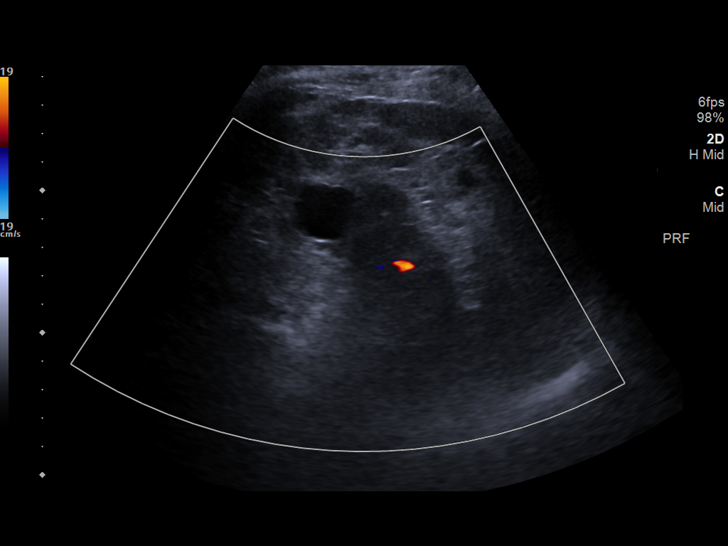
[im 28/38]
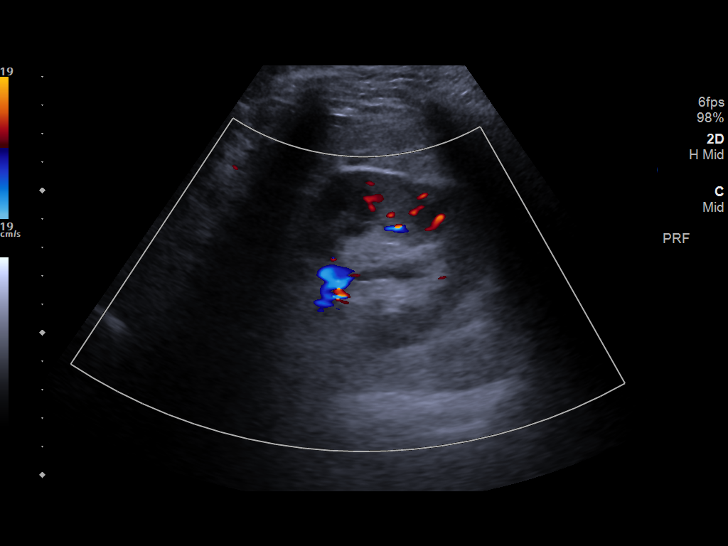
[im 31/38]
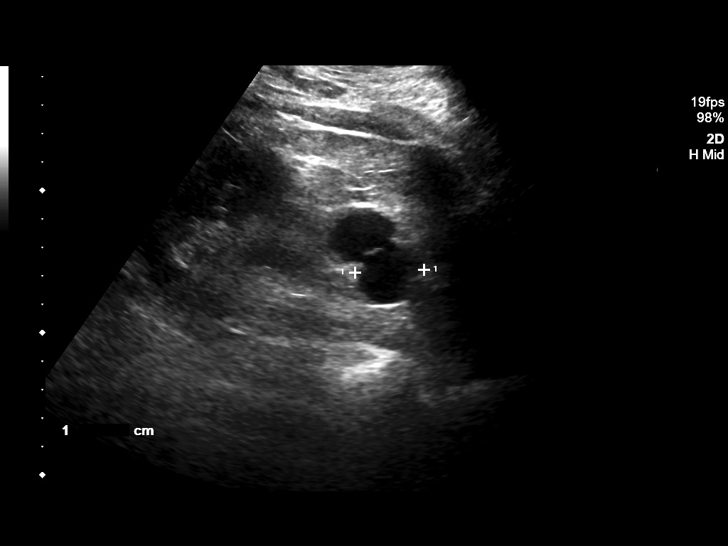
[im 34/38]
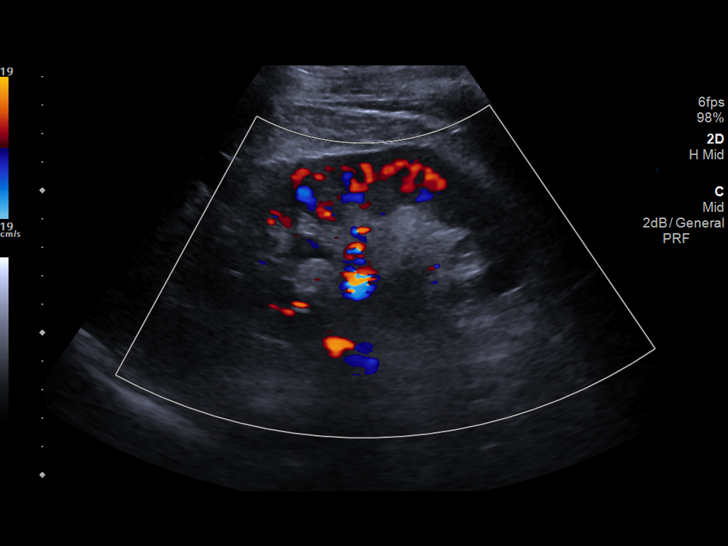
[im 38/38]
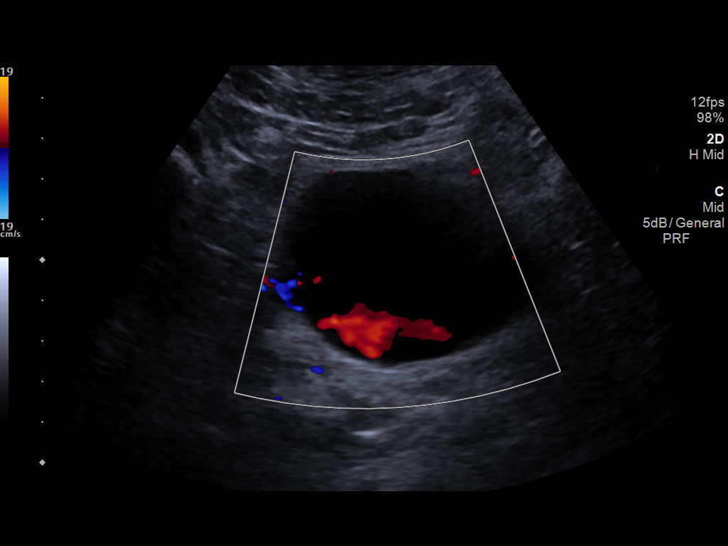

[14 of 25 positions shown; findings below may reference images not displayed]

FINDINGS: Right Kidney:

Renal measurements: 11.6 x 4.6 x 4.9 cm = volume: 136 mL. Cortical
echogenicity is within normal limits. RIGHT renal cyst measures
cm. No hydronephrosis.

Left Kidney:

Renal measurements: 12.1 x 5.9 x 5.1 cm = volume: 190 mL. Cortical
echogenicity is within normal limits. Multiple LEFT renal cysts,
largest measuring 2.4 cm. No hydronephrosis.

Bladder:

Appears normal for degree of bladder distention.

Other:

None.
IMPRESSION: 1. No acute findings.  No hydronephrosis.
2. Bilateral renal cysts.

## 2023-01-28 ENCOUNTER — Other Ambulatory Visit: Payer: Self-pay | Admitting: Urology

## 2023-02-02 ENCOUNTER — Other Ambulatory Visit: Payer: Self-pay | Admitting: Urology

## 2023-02-04 ENCOUNTER — Ambulatory Visit: Payer: Managed Care, Other (non HMO) | Admitting: Dermatology

## 2023-02-04 DIAGNOSIS — Z86018 Personal history of other benign neoplasm: Secondary | ICD-10-CM

## 2023-02-04 DIAGNOSIS — L814 Other melanin hyperpigmentation: Secondary | ICD-10-CM | POA: Diagnosis not present

## 2023-02-04 DIAGNOSIS — L718 Other rosacea: Secondary | ICD-10-CM

## 2023-02-04 DIAGNOSIS — L821 Other seborrheic keratosis: Secondary | ICD-10-CM

## 2023-02-04 DIAGNOSIS — L219 Seborrheic dermatitis, unspecified: Secondary | ICD-10-CM | POA: Diagnosis not present

## 2023-02-04 DIAGNOSIS — L570 Actinic keratosis: Secondary | ICD-10-CM | POA: Diagnosis not present

## 2023-02-04 DIAGNOSIS — D229 Melanocytic nevi, unspecified: Secondary | ICD-10-CM

## 2023-02-04 DIAGNOSIS — Z1283 Encounter for screening for malignant neoplasm of skin: Secondary | ICD-10-CM

## 2023-02-04 DIAGNOSIS — L603 Nail dystrophy: Secondary | ICD-10-CM

## 2023-02-04 DIAGNOSIS — W908XXA Exposure to other nonionizing radiation, initial encounter: Secondary | ICD-10-CM

## 2023-02-04 DIAGNOSIS — X32XXXA Exposure to sunlight, initial encounter: Secondary | ICD-10-CM

## 2023-02-04 DIAGNOSIS — Z872 Personal history of diseases of the skin and subcutaneous tissue: Secondary | ICD-10-CM

## 2023-02-04 DIAGNOSIS — L719 Rosacea, unspecified: Secondary | ICD-10-CM

## 2023-02-04 DIAGNOSIS — D239 Other benign neoplasm of skin, unspecified: Secondary | ICD-10-CM

## 2023-02-04 DIAGNOSIS — L578 Other skin changes due to chronic exposure to nonionizing radiation: Secondary | ICD-10-CM

## 2023-02-04 DIAGNOSIS — D1801 Hemangioma of skin and subcutaneous tissue: Secondary | ICD-10-CM

## 2023-02-04 DIAGNOSIS — D2372 Other benign neoplasm of skin of left lower limb, including hip: Secondary | ICD-10-CM

## 2023-02-04 DIAGNOSIS — D2371 Other benign neoplasm of skin of right lower limb, including hip: Secondary | ICD-10-CM

## 2023-02-04 MED ORDER — TAVABOROLE 5 % EX SOLN: 1.0000 "application " | Freq: Every day | CUTANEOUS | 11 refills | Status: DC

## 2023-02-04 MED ORDER — DOXYCYCLINE HYCLATE 20 MG PO TABS: 40.0000 mg | ORAL_TABLET | ORAL | 11 refills | Status: DC

## 2023-02-04 NOTE — Progress Notes (Signed)
Follow-Up Visit   Subjective  Keith Jackson is a 62 y.o. male who presents for the following: Skin Cancer Screening and Full Body Skin Exam Hx of dysplastic, hx of aks, hx of seb derm at ears, hx of rosacea, hx of tinea  Reports some spots at scalp he would like checked.    The patient presents for Total-Body Skin Exam (TBSE) for skin cancer screening and mole check. The patient has spots, moles and lesions to be evaluated, some may be new or changing and the patient has concerns that these could be cancer.    The following portions of the chart were reviewed this encounter and updated as appropriate: medications, allergies, medical history  Review of Systems:  No other skin or systemic complaints except as noted in HPI or Assessment and Plan.  Objective  Well appearing patient in no apparent distress; mood and affect are within normal limits.  A full examination was performed including scalp, head, eyes, ears, nose, lips, neck, chest, axillae, abdomen, back, buttocks, bilateral upper extremities, bilateral lower extremities, hands, feet, fingers, toes, fingernails, and toenails. All findings within normal limits unless otherwise noted below.   Relevant physical exam findings are noted in the Assessment and Plan.  Scalp x 7, forehead x 2, left temple x 1, right sideburn x 2, nasal dorsum x 1 (13) Erythematous thin papules/macules with gritty scale.     Assessment & Plan  SKIN CANCER SCREENING PERFORMED TODAY.  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 Currently well-controlled.   Treatment Plan: Continue Kerydin 5 % soln - apply topically to aa's toenail and left thumb nail at bedtime. Rx sent to [facility]    Seborrheic dermatitis b/l  ears Pink scaliness in ears   Chronic and persistent condition with duration or expected duration over one year. Condition is symptomatic/ bothersome to patient. Not currently at goal.    Seborrheic Dermatitis  -  is a chronic persistent rash characterized by pinkness and scaling most commonly of the mid face but also can occur on the scalp (dandruff), ears; mid chest, mid back and groin.  It tends to be exacerbated by stress and cooler weather.  People who have neurologic disease may experience new onset or exacerbation of existing seborrheic dermatitis.  The condition is not curable but treatable and can be controlled.   Patient deferred prescription treatment  Continue OTC vaseline ointment prn   LENTIGINES, SEBORRHEIC KERATOSES, HEMANGIOMAS - Benign normal skin lesions - Benign-appearing - Call for any changes  Nevus Left Medial Thigh  1 mm medium gray brown macule    Benign-appearing. Stable compared to previous visit. Observation.  Call clinic for new or changing moles.  Recommend daily use of broad spectrum spf 30+ sunscreen to sun-exposed areas.  .   Dermatofibroma - Firm pink/brown papulenodule with dimple sign at left medial knee and right upper knee  - Benign appearing - Call for any changes  MELANOCYTIC NEVI - Tan-brown and/or pink-flesh-colored symmetric macules and papules - Benign appearing on exam today - Observation - Call clinic for new or changing moles - Recommend daily use of broad spectrum spf 30+ sunscreen to sun-exposed areas.   ACTINIC DAMAGE WITH PRECANCEROUS ACTINIC KERATOSES Counseling for Topical Chemotherapy Management: Patient exhibits: - Severe, confluent actinic changes with pre-cancerous actinic keratoses  that is secondary to cumulative UV radiation exposure over time - Condition that is severe; chronic, not at goal. - diffuse scaly erythematous macules and papules with underlying dyspigmentation - Discussed Prescription "Field Treatment"  topical Chemotherapy for Severe, Chronic Confluent Actinic Changes with Pre-Cancerous Actinic Keratoses Field treatment involves treatment of an entire area of skin that has confluent Actinic Changes (Sun/ Ultraviolet light damage) and PreCancerous Actinic Keratoses by method of PhotoDynamic Therapy (PDT) and/or prescription Topical Chemotherapy agents such as 5-fluorouracil, 5-fluorouracil/calcipotriene, and/or imiquimod.  The purpose is to decrease the number of clinically evident and subclinical PreCancerous lesions to prevent progression to development of skin cancer by chemically destroying early precancer changes that may or may not be visible.  It has been shown to reduce the risk of developing skin cancer in the treated area. As a result of treatment, redness, scaling, crusting, and open sores may occur during treatment course. One or more than one of these methods may be used and may have to be used several times to control, suppress and eliminate the PreCancerous changes. Discussed treatment course, expected reaction, and possible side effects. - Recommend daily broad spectrum sunscreen SPF 30+ to sun-exposed areas, reapply every 2 hours as needed.  - Staying in the shade or wearing long sleeves, sun glasses (UVA+UVB protection) and wide brim hats (4-inch brim around the entire circumference of the hat) are also recommended. - Call for new or changing lesions.  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  - Call if any new or changing lesions are noted between office visits  Rosacea with ocular rosacea  Face Redness and telangectasia at BL cheeks     Chronic condition with duration or expected duration over one year. Currently well-controlled.   Rosacea is a chronic progressive skin condition usually affecting the face of adults, causing redness and/or acne bumps. It is treatable but not curable. It sometimes affects the eyes (ocular rosacea) as well. It may respond to topical and/or systemic medication and can flare with stress, sun exposure, alcohol, exercise and some foods.  Daily application of broad spectrum spf 30+ sunscreen to face is recommended to reduce flares.   D/C doxycycline 50 mg   Start doxycyline 20 mg capsule  take 2 cap qd with food prn flares    Doxycycline should be taken with food to prevent nausea. Do not lay down for 30 minutes after taking. Be cautious with sun exposure and use good sun protection while on this medication. Pregnant women should not take this medication.   Rosacea  Related Medications doxycycline (PERIOSTAT) 20 MG tablet Take 2 tablets (40 mg total) by mouth as directed. Take with food and drink prn for flares  Actinic keratosis (13) Scalp x 7, forehead x 2, left temple x 1, right sideburn x 2, nasal dorsum x 1  - Will schedule Red Light photodynamic therapy with debridement to the scalp    Actinic keratoses are precancerous spots that appear secondary to cumulative UV radiation exposure/sun exposure over time. They are chronic with expected duration over 1 year. A portion of actinic keratoses will progress to squamous cell carcinoma of the skin.  It is not possible to reliably predict which spots will progress to skin cancer and so treatment is recommended to prevent development of skin cancer.  Recommend daily broad spectrum sunscreen SPF 30+ to sun-exposed areas, reapply every 2 hours as needed.  Recommend staying in the shade or wearing long sleeves, sun glasses (UVA+UVB protection) and wide brim hats (4-inch brim around the entire circumference of the hat). Call for new or changing  lesions.  Destruction of lesion - Scalp x 7, forehead x 2, left temple x 1, right sideburn x 2, nasal dorsum x 1  Destruction method: cryotherapy   Informed consent: discussed and consent obtained   Lesion destroyed using liquid nitrogen: Yes   Region frozen until ice ball extended beyond lesion: Yes   Outcome: patient tolerated procedure well with no complications   Post-procedure details: wound care instructions given   Additional details:  Prior to procedure, discussed risks of blister formation, small wound, skin dyspigmentation, or rare scar following cryotherapy. Recommend Vaseline ointment to treated areas while healing.   Nail dystrophy  Related Medications Tavaborole (KERYDIN) 5 % SOLN Apply 1 application  topically at bedtime. Apply to left thumb nail and left toenail   Return in about 1 year (around 02/04/2024) for TBSE.  I, Asher Muir, CMA, am acting as scribe for Willeen Niece, MD.   Documentation: I have reviewed the above documentation for accuracy and completeness, and I agree with the above.  Willeen Niece, MD

## 2023-02-04 NOTE — Patient Instructions (Addendum)
Actinic keratoses are precancerous spots that appear secondary to cumulative UV radiation exposure/sun exposure over time. They are chronic with expected duration over 1 year. A portion of actinic keratoses will progress to squamous cell carcinoma of the skin. It is not possible to reliably predict which spots will progress to skin cancer and so treatment is recommended to prevent development of skin cancer.  Recommend daily broad spectrum sunscreen SPF 30+ to sun-exposed areas, reapply every 2 hours as needed.  Recommend staying in the shade or wearing long sleeves, sun glasses (UVA+UVB protection) and wide brim hats (4-inch brim around the entire circumference of the hat). Call for new or changing lesions.    Cryotherapy Aftercare  Wash gently with soap and water everyday.   Apply Vaseline and Band-Aid daily until healed.        Melanoma ABCDEs  Melanoma is the most dangerous type of skin cancer, and is the leading cause of death from skin disease.  You are more likely to develop melanoma if you: Have light-colored skin, light-colored eyes, or red or blond hair Spend a lot of time in the sun Tan regularly, either outdoors or in a tanning bed Have had blistering sunburns, especially during childhood Have a close family member who has had a melanoma Have atypical moles or large birthmarks  Early detection of melanoma is key since treatment is typically straightforward and cure rates are extremely high if we catch it early.   The first sign of melanoma is often a change in a mole or a new dark spot.  The ABCDE system is a way of remembering the signs of melanoma.  A for asymmetry:  The two halves do not match. B for border:  The edges of the growth are irregular. C for color:  A mixture of colors are present instead of an even brown color. D for diameter:  Melanomas are usually (but not always) greater than 6mm - the size of a pencil eraser. E for evolution:  The spot keeps  changing in size, shape, and color.  Please check your skin once per month between visits. You can use a small mirror in front and a large mirror behind you to keep an eye on the back side or your body.   If you see any new or changing lesions before your next follow-up, please call to schedule a visit.  Please continue daily skin protection including broad spectrum sunscreen SPF 30+ to sun-exposed areas, reapplying every 2 hours as needed when you're outdoors.   Staying in the shade or wearing long sleeves, sun glasses (UVA+UVB protection) and wide brim hats (4-inch brim around the entire circumference of the hat) are also recommended for sun protection.    Due to recent changes in healthcare laws, you may see results of your pathology and/or laboratory studies on MyChart before the doctors have had a chance to review them. We understand that in some cases there may be results that are confusing or concerning to you. Please understand that not all results are received at the same time and often the doctors may need to interpret multiple results in order to provide you with the best plan of care or course of treatment. Therefore, we ask that you please give us 2 business days to thoroughly review all your results before contacting the office for clarification. Should we see a critical lab result, you will be contacted sooner.   If You Need Anything After Your Visit  If you have any questions   or concerns for your doctor, please call our main line at [phone] and press option 4 to reach your doctor's medical assistant. If no one answers, please leave a voicemail as directed and we will return your call as soon as possible. Messages left after 4 pm will be answered the following business day.   You may also send us a message via MyChart. We typically respond to MyChart messages within 1-2 business days.  For prescription refills, please ask your pharmacy to contact our office. Our fax number is  [phone].  If you have an urgent issue when the clinic is closed that cannot wait until the next business day, you can page your doctor at the number below.    Please note that while we do our best to be available for urgent issues outside of office hours, we are not available 24/7.   If you have an urgent issue and are unable to reach us, you may choose to seek medical care at your doctor's office, retail clinic, urgent care center, or emergency room.  If you have a medical emergency, please immediately call 911 or go to the emergency department.  Pager Numbers  - Dr. Barringer: [phone]  - Dr. Moye: [phone]  - Dr. Stewart: [phone]  In the event of inclement weather, please call our main line at [phone] for an update on the status of any delays or closures.  Dermatology Medication Tips: Please keep the boxes that topical medications come in in order to help keep track of the instructions about where and how to use these. Pharmacies typically print the medication instructions only on the boxes and not directly on the medication tubes.   If your medication is too expensive, please contact our office at [phone] option 4 or send us a message through MyChart.   We are unable to tell what your co-pay for medications will be in advance as this is different depending on your insurance coverage. However, we may be able to find a substitute medication at lower cost or fill out paperwork to get insurance to cover a needed medication.   If a prior authorization is required to get your medication covered by your insurance company, please allow us 1-2 business days to complete this process.  Drug prices often vary depending on where the prescription is filled and some pharmacies may offer cheaper prices.  The website www.goodrx.com contains coupons for medications through different pharmacies. The prices here do not account for what the cost may be with help from  insurance (it may be cheaper with your insurance), but the website can give you the price if you did not use any insurance.  - You can print the associated coupon and take it with your prescription to the pharmacy.  - You may also stop by our office during regular business hours and pick up a GoodRx coupon card.  - If you need your prescription sent electronically to a different pharmacy, notify our office through [place] MyChart or by phone at [phone] option 4.     Si Usted Necesita Algo Despus de Su Visita  Tambin puede enviarnos un mensaje a travs de MyChart. Por lo general respondemos a los mensajes de MyChart en el transcurso de 1 a 2 das hbiles.  Para renovar recetas, por favor pida a su farmacia que se ponga en contacto con nuestra oficina. Nuestro nmero de fax es el [phone].  Si tiene un asunto urgente cuando la clnica est cerrada y que no puede   esperar hasta el siguiente da hbil, puede llamar/localizar a su doctor(a) al nmero que aparece a continuacin.   Por favor, tenga en cuenta que aunque hacemos todo lo posible para estar disponibles para asuntos urgentes fuera del horario de oficina, no estamos disponibles las 24 horas del da, los 7 das de la semana.   Si tiene un problema urgente y no puede comunicarse con nosotros, puede optar por buscar atencin mdica  en el consultorio de su doctor(a), en una clnica privada, en un centro de atencin urgente o en una sala de emergencias.  Si tiene una emergencia mdica, por favor llame inmediatamente al 911 o vaya a la sala de emergencias.  Nmeros de bper  - Dr. Vullo: [phone]  - Dra. Moye: [phone]  - Dra. Stewart: [phone]  En caso de inclemencias del tiempo, por favor llame a nuestra lnea principal al [phone] para una actualizacin sobre el estado de cualquier retraso o cierre.  Consejos para la medicacin en dermatologa: Por favor, guarde las cajas en las que vienen los  medicamentos de uso tpico para ayudarle a seguir las instrucciones sobre dnde y cmo usarlos. Las farmacias generalmente imprimen las instrucciones del medicamento slo en las cajas y no directamente en los tubos del medicamento.   Si su medicamento es muy caro, por favor, pngase en contacto con nuestra oficina llamando al [phone] y presione la opcin 4 o envenos un mensaje a travs de MyChart.   No podemos decirle cul ser su copago por los medicamentos por adelantado ya que esto es diferente dependiendo de la cobertura de su seguro. Sin embargo, es posible que podamos encontrar un medicamento sustituto a menor costo o llenar un formulario para que el seguro cubra el medicamento que se considera necesario.   Si se requiere una autorizacin previa para que su compaa de seguros cubra su medicamento, por favor permtanos de 1 a 2 das hbiles para completar este proceso.  Los precios de los medicamentos varan con frecuencia dependiendo del lugar de dnde se surte la receta y alguna farmacias pueden ofrecer precios ms baratos.  El sitio web www.goodrx.com tiene cupones para medicamentos de diferentes farmacias. Los precios aqu no tienen en cuenta lo que podra costar con la ayuda del seguro (puede ser ms barato con su seguro), pero el sitio web puede darle el precio si no utiliz ningn seguro.  - Puede imprimir el cupn correspondiente y llevarlo con su receta a la farmacia.  - Tambin puede pasar por nuestra oficina durante el horario de atencin regular y recoger una tarjeta de cupones de GoodRx.  - Si necesita que su receta se enve electrnicamente a una farmacia diferente, informe a nuestra oficina a travs de MyChart de [place] o por telfono llamando al [phone] y presione la opcin 4.  

## 2023-04-10 ENCOUNTER — Encounter: Payer: Self-pay | Admitting: Gastroenterology

## 2023-04-15 ENCOUNTER — Ambulatory Visit
Admission: RE | Admit: 2023-04-15 | Discharge: 2023-04-15 | Disposition: A | Discharge status: Home / Self Care | Payer: Managed Care, Other (non HMO) | Attending: Gastroenterology | Admitting: Gastroenterology

## 2023-04-15 ENCOUNTER — Encounter
Admission: RE | Disposition: A | Discharge status: Home / Self Care | Payer: Self-pay | Source: Home / Self Care | Attending: Gastroenterology

## 2023-04-15 ENCOUNTER — Ambulatory Visit: Payer: Managed Care, Other (non HMO) | Admitting: Certified Registered"

## 2023-04-15 ENCOUNTER — Encounter: Payer: Self-pay | Admitting: Gastroenterology

## 2023-04-15 DIAGNOSIS — D125 Benign neoplasm of sigmoid colon: Secondary | ICD-10-CM | POA: Insufficient documentation

## 2023-04-15 DIAGNOSIS — K640 First degree hemorrhoids: Secondary | ICD-10-CM | POA: Insufficient documentation

## 2023-04-15 DIAGNOSIS — D128 Benign neoplasm of rectum: Secondary | ICD-10-CM | POA: Diagnosis not present

## 2023-04-15 DIAGNOSIS — Z86010 Personal history of colonic polyps: Secondary | ICD-10-CM | POA: Insufficient documentation

## 2023-04-15 DIAGNOSIS — Z1211 Encounter for screening for malignant neoplasm of colon: Secondary | ICD-10-CM | POA: Insufficient documentation

## 2023-04-15 HISTORY — PX: POLYPECTOMY: SHX5525

## 2023-04-15 HISTORY — PX: COLONOSCOPY WITH PROPOFOL: SHX5780

## 2023-04-15 SURGERY — COLONOSCOPY WITH PROPOFOL
Anesthesia: General

## 2023-04-15 MED ORDER — LIDOCAINE HCL (CARDIAC) PF 100 MG/5ML IV SOSY
PREFILLED_SYRINGE | INTRAVENOUS | Status: DC | PRN
  Administered 2023-04-15: 50 mg via INTRAVENOUS

## 2023-04-15 MED ORDER — SODIUM CHLORIDE 0.9 % IV SOLN: INTRAVENOUS | Status: DC

## 2023-04-15 MED ORDER — PROPOFOL 10 MG/ML IV BOLUS
INTRAVENOUS | Status: DC | PRN
  Administered 2023-04-15: 80 mg via INTRAVENOUS

## 2023-04-15 MED ORDER — PROPOFOL 500 MG/50ML IV EMUL
INTRAVENOUS | Status: DC | PRN
  Administered 2023-04-15: 150 ug/kg/min via INTRAVENOUS

## 2023-04-15 NOTE — Transfer of Care (Signed)
Immediate Anesthesia Transfer of Care Note  Patient: Keith Jackson  Procedure(s) Performed: COLONOSCOPY WITH PROPOFOL POLYPECTOMY  Patient Location: PACU and Endoscopy Unit  Anesthesia Type:General  Level of Consciousness: drowsy and patient cooperative  Airway & Oxygen Therapy: Patient Spontanous Breathing  Post-op Assessment: Report given to RN and Post -op Vital signs reviewed and stable  Post vital signs: Reviewed and stable  Last Vitals:  Vitals Value Taken Time  BP 90/64 04/15/23 0904  Temp 36.1 C 04/15/23 0904  Pulse 70 04/15/23 0905  Resp 14 04/15/23 0904  SpO2 98 % 04/15/23 0905  Vitals shown include unfiled device data.  Last Pain:  Vitals:   04/15/23 0904  TempSrc:   PainSc: Asleep         Complications: No notable events documented.

## 2023-04-15 NOTE — Anesthesia Postprocedure Evaluation (Signed)
Anesthesia Post Note  Patient: Keith Jackson  Procedure(s) Performed: COLONOSCOPY WITH PROPOFOL POLYPECTOMY  Patient location during evaluation: Endoscopy Anesthesia Type: General Level of consciousness: awake and alert Pain management: pain level controlled Vital Signs Assessment: post-procedure vital signs reviewed and stable Respiratory status: spontaneous breathing, nonlabored ventilation, respiratory function stable and patient connected to nasal cannula oxygen Cardiovascular status: blood pressure returned to baseline and stable Postop Assessment: no apparent nausea or vomiting Anesthetic complications: no   No notable events documented.   Last Vitals:  Vitals:   04/15/23 0904 04/15/23 0934  BP: 90/64   Pulse: 70   Resp: 14   Temp: (!) 36.1 C   SpO2: 98% 100%    Last Pain:  Vitals:   04/15/23 0914  TempSrc:   PainSc: 0-No pain                 Cleda Mccreedy Rayann Jolley

## 2023-04-15 NOTE — Anesthesia Preprocedure Evaluation (Signed)
Anesthesia Evaluation  Patient identified by MRN, date of birth, ID band Patient awake    Reviewed: Allergy & Precautions, NPO status , Patient's Chart, lab work & pertinent test results  History of Anesthesia Complications Negative for: history of anesthetic complications  Airway Mallampati: III  TM Distance: >3 FB Neck ROM: full    Dental  (+) Chipped   Pulmonary neg pulmonary ROS, neg shortness of breath   Pulmonary exam normal        Cardiovascular Exercise Tolerance: Good negative cardio ROS Normal cardiovascular exam     Neuro/Psych  Neuromuscular disease  negative psych ROS   GI/Hepatic negative GI ROS, Neg liver ROS,neg GERD  ,,  Endo/Other  negative endocrine ROS    Renal/GU Renal disease  negative genitourinary   Musculoskeletal   Abdominal   Peds  Hematology negative hematology ROS (+)   Anesthesia Other Findings Past Medical History: 05/11/2008: History of dysplastic nevus     Comment:  left mid to upper back/moderate 06/22/2013: History of dysplastic nevus     Comment:  left mid to lateral tricep/mild No date: History of kidney stones  Past Surgical History: 09/02/2015: COLONOSCOPY WITH PROPOFOL; N/A     Comment:  Procedure: COLONOSCOPY WITH PROPOFOL;  Surgeon: Scot Jun, MD;  Location[facility] ENDOSCOPY;  Service:               Endoscopy;  Laterality: N/A; 01/09/2021: EXTRACORPOREAL SHOCK WAVE LITHOTRIPSY; Left     Comment:  Procedure: EXTRACORPOREAL SHOCK WAVE LITHOTRIPSY (ESWL);              Surgeon: Bjorn Pippin, MD;  Location: [facility];  Service: Urology;  Laterality: Left; 2010-12: LITHOTRIPSY  BMI    Body Mass Index: 22.79 kg/m      Reproductive/Obstetrics negative OB ROS                             Anesthesia Physical Anesthesia Plan  ASA: 2  Anesthesia Plan: General   Post-op Pain Management:     Induction: Intravenous  PONV Risk Score and Plan: Propofol infusion and TIVA  Airway Management Planned: Natural Airway and Nasal Cannula  Additional Equipment:   Intra-op Plan:   Post-operative Plan:   Informed Consent: I have reviewed the patients History and Physical, chart, labs and discussed the procedure including the risks, benefits and alternatives for the proposed anesthesia with the patient or authorized representative who has indicated his/her understanding and acceptance.     Dental Advisory Given  Plan Discussed with: Anesthesiologist, CRNA and Surgeon  Anesthesia Plan Comments: (Patient consented for risks of anesthesia including but not limited to:  - adverse reactions to medications - risk of airway placement if required - damage to eyes, teeth, lips or other oral mucosa - nerve damage due to positioning  - sore throat or hoarseness - Damage to heart, brain, nerves, lungs, other parts of body or loss of life  Patient voiced understanding.)       Anesthesia Quick Evaluation

## 2023-04-15 NOTE — H&P (Signed)
Pre-Procedure H&P   Patient ID: Keith Jackson is a 62 y.o. male.  Gastroenterology Provider: [name], DO  Referring Provider: Dr. Judithann Sheen PCP: Marguarite Arbour, MD  Date: 04/15/2023  HPI Keith Jackson is a 62 y.o. male who presents today for Colonoscopy for Surveillance-personal history of colon polyps .  Patient reports regular bowel movements no melena or hematochezia  No family history of colon cancer or colon polyps  History of Chiari malformation with CSF leak x 2  Last colonoscopy December 2016 with 1 adenomatous polyp and internal hemorrhoids   Past Medical History:  Diagnosis Date   History of dysplastic nevus 05/11/2008   left mid to upper back/moderate   History of dysplastic nevus 06/22/2013   left mid to lateral tricep/mild   History of kidney stones     Past Surgical History:  Procedure Laterality Date   COLONOSCOPY WITH PROPOFOL N/A 09/02/2015   Procedure: COLONOSCOPY WITH PROPOFOL;  Surgeon: Scot Jun, MD;  Location: [facility] ENDOSCOPY;  Service: Endoscopy;  Laterality: N/A;   EXTRACORPOREAL SHOCK WAVE LITHOTRIPSY Left 01/09/2021   Procedure: EXTRACORPOREAL SHOCK WAVE LITHOTRIPSY (ESWL);  Surgeon: Bjorn Pippin, MD;  Location: [facility];  Service: Urology;  Laterality: Left;   LITHOTRIPSY  2010-12    Family History No h/o GI disease or malignancy  Review of Systems  Constitutional:  Negative for activity change, appetite change, chills, diaphoresis, fatigue, fever and unexpected weight change.  HENT:  Negative for trouble swallowing and voice change.   Respiratory:  Negative for shortness of breath and wheezing.   Cardiovascular:  Negative for chest pain, palpitations and leg swelling.  Gastrointestinal:  Negative for abdominal distention, abdominal pain, anal bleeding, blood in stool, constipation, diarrhea, nausea and vomiting.  Musculoskeletal:  Negative for arthralgias and myalgias.  Skin:  Negative for color  change and pallor.  Neurological:  Negative for dizziness, syncope and weakness.  Psychiatric/Behavioral:  Negative for confusion. The patient is not nervous/anxious.   All other systems reviewed and are negative.    Medications No current facility-administered medications on file prior to encounter.   Current Outpatient Medications on File Prior to Encounter  Medication Sig Dispense Refill   ipratropium (ATROVENT) 0.06 % nasal spray Place 2 sprays into both nostrils 3 (three) times daily as needed (allergies).     sildenafil (REVATIO) 20 MG tablet 2-5 tabs 1 hour prior to intercourse (Patient not taking: Reported on 02/04/2023) 30 tablet 0    Pertinent medications related to GI and procedure were reviewed by me with the patient prior to the procedure   Current Facility-Administered Medications:    0.9 %  sodium chloride infusion, , Intravenous, Continuous, [name], DO  sodium chloride         No Known Allergies Allergies were reviewed by me prior to the procedure  Objective   Body mass index is 22.79 kg/m. Vitals:   04/15/23 0822  BP: 123/80  Pulse: 72  Resp: 16  Temp: (!) 97.3 F (36.3 C)  TempSrc: Temporal  SpO2: 100%  Weight: 70 kg  Height: 5\' 9"  (1.753 m)     Physical Exam Vitals and nursing note reviewed.  Constitutional:      General: He is not in acute distress.    Appearance: Normal appearance. He is not ill-appearing, toxic-appearing or diaphoretic.  HENT:     Head: Normocephalic and atraumatic.     Nose: Nose normal.     Mouth/Throat:     Mouth:  Mucous membranes are moist.     Pharynx: Oropharynx is clear.  Eyes:     General: No scleral icterus.    Extraocular Movements: Extraocular movements intact.  Cardiovascular:     Rate and Rhythm: Normal rate and regular rhythm.     Heart sounds: Normal heart sounds. No murmur heard.    No friction rub. No gallop.  Pulmonary:     Effort: Pulmonary effort is normal. No respiratory distress.      Breath sounds: Normal breath sounds. No wheezing, rhonchi or rales.  Abdominal:     General: Bowel sounds are normal. There is no distension.     Palpations: Abdomen is soft.     Tenderness: There is no abdominal tenderness. There is no guarding or rebound.  Musculoskeletal:     Cervical back: Neck supple.     Right lower leg: No edema.     Left lower leg: No edema.  Skin:    General: Skin is warm and dry.     Coloration: Skin is not jaundiced or pale.  Neurological:     General: No focal deficit present.     Mental Status: He is alert and oriented to person, place, and time. Mental status is at baseline.  Psychiatric:        Mood and Affect: Mood normal.        Behavior: Behavior normal.        Thought Content: Thought content normal.        Judgment: Judgment normal.      Assessment:  Keith Jackson is a 62 y.o. male  who presents today for Colonoscopy for Surveillance-personal history of colon polyps .  Plan:  Colonoscopy with possible intervention today  Colonoscopy with possible biopsy, control of bleeding, polypectomy, and interventions as necessary has been discussed with the patient/patient representative. Informed consent was obtained from the patient/patient representative after explaining the indication, nature, and risks of the procedure including but not limited to death, bleeding, perforation, missed neoplasm/lesions, cardiorespiratory compromise, and reaction to medications. Opportunity for questions was given and appropriate answers were provided. Patient/patient representative has verbalized understanding is amenable to undergoing the procedure.   [name], DO  [facility] Gastroenterology  Portions of the record may have been created with voice recognition software. Occasional wrong-word or 'sound-a-like' substitutions may have occurred due to the inherent limitations of voice recognition software.  Read the chart carefully and recognize,  using context, where substitutions may have occurred.

## 2023-04-15 NOTE — Op Note (Signed)
[facility] Gastroenterology Patient Name: Keith Jackson Procedure Date: 04/15/2023 8:21 AM MRN: [id number] Account #: [id number] Date of Birth: 06/21/1961 Admit Type: Outpatient Age: 62 Room: [facility] ENDO ROOM 2 Gender: Male Note Status: Finalized Instrument Name: Colonoscope [id number] Procedure:             Colonoscopy Indications:           High risk colon cancer surveillance: Personal history                         of colonic polyps Providers:             Trenda Moots, DO Referring MD:          [name] DO, DO (Referring MD), Duane Lope.                         Judithann Sheen, MD (Referring MD) Medicines:             Monitored Anesthesia Care Complications:         No immediate complications. Estimated blood loss:                         Minimal. Procedure:             Pre-Anesthesia Assessment:                        - Prior to the procedure, a History and Physical was                         performed, and patient medications and allergies were                         reviewed. The patient is competent. The risks and                         benefits of the procedure and the sedation options and                         risks were discussed with the patient. All questions                         were answered and informed consent was obtained.                         Patient identification and proposed procedure were                         verified by the physician, the nurse, the anesthetist                         and the technician in the endoscopy suite. Mental                         Status Examination: alert and oriented. Airway                         Examination: normal oropharyngeal airway and neck  mobility. Respiratory Examination: clear to                         auscultation. CV Examination: RRR, no murmurs, no S3                         or S4. Prophylactic Antibiotics: The patient does not                          require prophylactic antibiotics. Prior                         Anticoagulants: The patient has taken no anticoagulant                         or antiplatelet agents. ASA Grade Assessment: II - A                         patient with mild systemic disease. After reviewing                         the risks and benefits, the patient was deemed in                         satisfactory condition to undergo the procedure. The                         anesthesia plan was to use monitored anesthesia care                         (MAC). Immediately prior to administration of                         medications, the patient was re-assessed for adequacy                         to receive sedatives. The heart rate, respiratory                         rate, oxygen saturations, blood pressure, adequacy of                         pulmonary ventilation, and response to care were                         monitored throughout the procedure. The physical                         status of the patient was re-assessed after the                         procedure.                        After obtaining informed consent, the colonoscope was                         passed under direct vision. Throughout the procedure,  the patient's blood pressure, pulse, and oxygen                         saturations were monitored continuously. The                         Colonoscope was introduced through the anus and                         advanced to the the cecum, identified by appendiceal                         orifice and ileocecal valve. The colonoscopy was                         performed without difficulty. The patient tolerated                         the procedure well. The quality of the bowel                         preparation was evaluated using the BBPS [facility] Bowel                         Preparation Scale) with scores of: Right Colon = 1                         (portion of mucosa seen, but  other areas not well seen                         due to staining, residual stool and/or opaque liquid),                         Transverse Colon = 2 (minor amount of residual                         staining, small fragments of stool and/or opaque                         liquid, but mucosa seen well) and Left Colon = 2                         (minor amount of residual staining, small fragments of                         stool and/or opaque liquid, but mucosa seen well). The                         total BBPS score equals 5. The quality of the bowel                         preparation was inadequate. The ileocecal valve,                         appendiceal orifice, and rectum were photographed. Findings:      The perianal and digital rectal examinations were normal. Pertinent  negatives include normal sphincter tone.      Retroflexion in the right colon was performed.      A 1 to 2 mm polyp was found in the sigmoid colon. The polyp was sessile.       The polyp was removed with a jumbo cold forceps. Resection and retrieval       were complete. Estimated blood loss was minimal.      Two sessile polyps were found in the rectum. The polyps were 1 mm in       size. These polyps were removed with a jumbo cold forceps. Resection and       retrieval were complete. Estimated blood loss was minimal.      Anal papilla(e) were hypertrophied. Biopsies were taken with a cold       forceps for histology. Estimated blood loss was minimal.      Non-bleeding internal hemorrhoids were found during retroflexion. The       hemorrhoids were Grade I (internal hemorrhoids that do not prolapse).       Estimated blood loss: none.      The exam was otherwise without abnormality on direct and retroflexion       views. Impression:            - Preparation of the colon was inadequate.                        - One 1 to 2 mm polyp in the sigmoid colon, removed                         with a jumbo cold forceps.  Resected and retrieved.                        - Two 1 mm polyps in the rectum, removed with a jumbo                         cold forceps. Resected and retrieved.                        - Anal papilla(e) were hypertrophied. Biopsied.                        - Non-bleeding internal hemorrhoids.                        - The examination was otherwise normal on direct and                         retroflexion views. Recommendation:        - Patient has a contact number available for                         emergencies. The signs and symptoms of potential                         delayed complications were discussed with the patient.                         Return to normal activities tomorrow. Written  discharge instructions were provided to the patient.                        - Discharge patient to home.                        - Resume previous diet.                        - Continue present medications.                        - Await pathology results.                        - Repeat colonoscopy within 6 months because the bowel                         preparation was poor.                        - Return to GI office as previously scheduled.                        - The findings and recommendations were discussed with                         the patient. Procedure Code(s):     --- Professional ---                        [phone], Colonoscopy, flexible; with biopsy, single or                         multiple Diagnosis Code(s):     --- Professional ---                        Z86.010, Personal history of colonic polyps                        K64.0, First degree hemorrhoids                        D12.5, Benign neoplasm of sigmoid colon                        D12.8, Benign neoplasm of rectum                        K62.89, Other specified diseases of anus and rectum CPT copyright 2022 [employer]. All rights reserved. The codes documented in this report are  preliminary and upon coder review may  be revised to meet current compliance requirements. Attending Participation:      I personally performed the entire procedure. Elfredia Nevins, DO [name] DO, DO 04/15/2023 9:05:16 AM This report has been signed electronically. Number of Addenda: 0 Note Initiated On: 04/15/2023 8:21 AM Scope Withdrawal Time: 0 hours 13 minutes 35 seconds  Total Procedure Duration: 0 hours 16 minutes 43 seconds  Estimated Blood Loss:  Estimated blood loss was minimal.      [facility]

## 2023-04-15 NOTE — Anesthesia Procedure Notes (Signed)
Procedure Name: MAC Date/Time: 04/15/2023 8:43 AM  Performed by: Cheral Bay, CRNAPre-anesthesia Checklist: Patient identified, Emergency Drugs available, Suction available, Patient being monitored and Timeout performed Patient Re-evaluated:Patient Re-evaluated prior to induction Oxygen Delivery Method: Nasal cannula Induction Type: IV induction Placement Confirmation: positive ETCO2 and CO2 detector

## 2023-04-15 NOTE — Interval H&P Note (Signed)
History and Physical Interval Note: Preprocedure H&P from 04/15/23  was reviewed and there was no interval change after seeing and examining the patient.  Written consent was obtained from the patient after discussion of risks, benefits, and alternatives. Patient has consented to proceed with Esophagogastroduodenoscopy and Colonoscopy with possible intervention   04/15/2023 8:26 AM  Keith Jackson  has presented today for surgery, with the diagnosis of Z86.010 (ICD-10-CM) - History of colon polyps.  The various methods of treatment have been discussed with the patient and family. After consideration of risks, benefits and other options for treatment, the patient has consented to  Procedure(s): COLONOSCOPY WITH PROPOFOL (N/A) as a surgical intervention.  The patient's history has been reviewed, patient examined, no change in status, stable for surgery.  I have reviewed the patient's chart and labs.  Questions were answered to the patient's satisfaction.     [name]

## 2023-04-16 ENCOUNTER — Encounter: Payer: Self-pay | Admitting: Gastroenterology

## 2024-02-04 ENCOUNTER — Ambulatory Visit: Payer: Managed Care, Other (non HMO) | Admitting: Dermatology

## 2024-02-04 DIAGNOSIS — L814 Other melanin hyperpigmentation: Secondary | ICD-10-CM

## 2024-02-04 DIAGNOSIS — Z86018 Personal history of other benign neoplasm: Secondary | ICD-10-CM

## 2024-02-04 DIAGNOSIS — L219 Seborrheic dermatitis, unspecified: Secondary | ICD-10-CM

## 2024-02-04 DIAGNOSIS — W908XXA Exposure to other nonionizing radiation, initial encounter: Secondary | ICD-10-CM | POA: Diagnosis not present

## 2024-02-04 DIAGNOSIS — L570 Actinic keratosis: Secondary | ICD-10-CM | POA: Diagnosis not present

## 2024-02-04 DIAGNOSIS — L821 Other seborrheic keratosis: Secondary | ICD-10-CM

## 2024-02-04 DIAGNOSIS — L649 Androgenic alopecia, unspecified: Secondary | ICD-10-CM

## 2024-02-04 DIAGNOSIS — Z1283 Encounter for screening for malignant neoplasm of skin: Secondary | ICD-10-CM

## 2024-02-04 DIAGNOSIS — D1801 Hemangioma of skin and subcutaneous tissue: Secondary | ICD-10-CM

## 2024-02-04 DIAGNOSIS — L578 Other skin changes due to chronic exposure to nonionizing radiation: Secondary | ICD-10-CM | POA: Diagnosis not present

## 2024-02-04 DIAGNOSIS — D224 Melanocytic nevi of scalp and neck: Secondary | ICD-10-CM

## 2024-02-04 DIAGNOSIS — D2371 Other benign neoplasm of skin of right lower limb, including hip: Secondary | ICD-10-CM

## 2024-02-04 DIAGNOSIS — L719 Rosacea, unspecified: Secondary | ICD-10-CM

## 2024-02-04 DIAGNOSIS — L0100 Impetigo, unspecified: Secondary | ICD-10-CM

## 2024-02-04 DIAGNOSIS — D2372 Other benign neoplasm of skin of left lower limb, including hip: Secondary | ICD-10-CM

## 2024-02-04 DIAGNOSIS — L603 Nail dystrophy: Secondary | ICD-10-CM

## 2024-02-04 DIAGNOSIS — D239 Other benign neoplasm of skin, unspecified: Secondary | ICD-10-CM

## 2024-02-04 DIAGNOSIS — D229 Melanocytic nevi, unspecified: Secondary | ICD-10-CM

## 2024-02-04 NOTE — Patient Instructions (Addendum)
 -Recommend OTC 1% hydrocortisone cream 1-2 times daily to affected area until itchy rash cleared.  -Continue Head & Shoulders to scalp and ears daily in shower. -Start over the counter Lotrimin or Micatin cream to affected ear twice daily for one month. Then apply 1-2 times per week to help prevent recurrence.   Cryotherapy Aftercare  Wash gently with soap and water everyday.   Apply Vaseline and Band-Aid daily until healed.   Seborrheic Keratosis  What causes seborrheic keratoses? Seborrheic keratoses are harmless, common skin growths that first appear during adult life.  As time goes by, more growths appear.  Some people may develop a large number of them.  Seborrheic keratoses appear on both covered and uncovered body parts.  They are not caused by sunlight.  The tendency to develop seborrheic keratoses can be inherited.  They vary in color from skin-colored to gray, brown, or even black.  They can be either smooth or have a rough, warty surface.   Seborrheic keratoses are superficial and look as if they were stuck on the skin.  Under the microscope this type of keratosis looks like layers upon layers of skin.  That is why at times the top layer may seem to fall off, but the rest of the growth remains and re-grows.    Treatment Seborrheic keratoses do not need to be treated, but can easily be removed in the office.  Seborrheic keratoses often cause symptoms when they rub on clothing or jewelry.  Lesions can be in the way of shaving.  If they become inflamed, they can cause itching, soreness, or burning.  Removal of a seborrheic keratosis can be accomplished by freezing, burning, or surgery. If any spot bleeds, scabs, or grows rapidly, please return to have it checked, as these can be an indication of a skin cancer.   Due to recent changes in healthcare laws, you may see results of your pathology and/or laboratory studies on MyChart before the doctors have had a chance to review them. We  understand that in some cases there may be results that are confusing or concerning to you. Please understand that not all results are received at the same time and often the doctors may need to interpret multiple results in order to provide you with the best plan of care or course of treatment. Therefore, we ask that you please give us  2 business days to thoroughly review all your results before contacting the office for clarification. Should we see a critical lab result, you will be contacted sooner.   If You Need Anything After Your Visit  If you have any questions or concerns for your doctor, please call our main line at [phone] and press option 4 to reach your doctor's medical assistant. If no one answers, please leave a voicemail as directed and we will return your call as soon as possible. Messages left after 4 pm will be answered the following business day.   You may also send us  a message via MyChart. We typically respond to MyChart messages within 1-2 business days.  For prescription refills, please ask your pharmacy to contact our office. Our fax number is [phone].  If you have an urgent issue when the clinic is closed that cannot wait until the next business day, you can page your doctor at the number below.    Please note that while we do our best to be available for urgent issues outside of office hours, we are not available 24/7.   If you  have an urgent issue and are unable to reach us , you may choose to seek medical care at your doctor's office, retail clinic, urgent care center, or emergency room.  If you have a medical emergency, please immediately call 911 or go to the emergency department.  Pager Numbers  - Dr. Bary Likes: [phone]  - Dr. Annette Barters: [phone]  - Dr. Felipe Horton: [phone]   In the event of inclement weather, please call our main line at [phone] for an update on the status of any delays or closures.  Dermatology Medication Tips: Please  keep the boxes that topical medications come in in order to help keep track of the instructions about where and how to use these. Pharmacies typically print the medication instructions only on the boxes and not directly on the medication tubes.   If your medication is too expensive, please contact our office at [phone] option 4 or send us  a message through MyChart.   We are unable to tell what your co-pay for medications will be in advance as this is different depending on your insurance coverage. However, we may be able to find a substitute medication at lower cost or fill out paperwork to get insurance to cover a needed medication.   If a prior authorization is required to get your medication covered by your insurance company, please allow us  1-2 business days to complete this process.  Drug prices often vary depending on where the prescription is filled and some pharmacies may offer cheaper prices.  The website www.goodrx.com contains coupons for medications through different pharmacies. The prices here do not account for what the cost may be with help from insurance (it may be cheaper with your insurance), but the website can give you the price if you did not use any insurance.  - You can print the associated coupon and take it with your prescription to the pharmacy.  - You may also stop by our office during regular business hours and pick up a GoodRx coupon card.  - If you need your prescription sent electronically to a different pharmacy, notify our office through [facility] or by phone at [phone] option 4.     Si Usted Necesita Algo Despus de Su Visita  Tambin puede enviarnos un mensaje a travs de Clinical cytogeneticist. Por lo general respondemos a los mensajes de MyChart en el transcurso de 1 a 2 das hbiles.  Para renovar recetas, por favor pida a su farmacia que se ponga en contacto con nuestra oficina. Franz Jacks de fax es [place] [phone].  Si tiene un asunto urgente  cuando la clnica est cerrada y que no puede esperar hasta el siguiente da hbil, puede llamar/localizar a su doctor(a) al nmero que aparece a continuacin.   Por favor, tenga en cuenta que aunque hacemos todo lo posible para estar disponibles para asuntos urgentes fuera del horario de [place], no estamos disponibles las 24 horas del da, los 7 [address]  [address] de la [place].   Si tiene un problema urgente y no puede comunicarse con nosotros, puede optar por buscar atencin mdica  en el consultorio de su doctor(a), en una clnica privada, en un centro de atencin urgente o en una sala de emergencias.  Si tiene Engineer, drilling, por favor llame inmediatamente al 911 o vaya a la sala de emergencias.  Nmeros de bper  - Dr. Bary Likes: [phone]  - Dra. Annette Barters: [phone]  - Dr. Felipe Horton: [phone]   En caso de inclemencias del tiempo, por favor llame a nuestra lnea principal  al [phone] para una actualizacin sobre el [place] de cualquier retraso o cierre.  Consejos para la medicacin en dermatologa: Por favor, guarde las cajas en las que vienen los medicamentos de uso tpico para ayudarle a seguir las instrucciones sobre dnde y cmo usarlos. Las farmacias generalmente imprimen las instrucciones del medicamento slo en las cajas y no directamente en los tubos del [place].   Si su medicamento es muy caro, por favor, pngase en contacto con Bettyjane Brunet llamando al [phone] y presione la opcin 4 o envenos un mensaje a travs de Clinical cytogeneticist.   No podemos decirle cul ser su copago por los medicamentos por adelantado ya que esto es diferente dependiendo de la cobertura de su seguro. Sin embargo, es posible que podamos encontrar un medicamento sustituto a Audiological scientist un formulario para que el seguro cubra el medicamento que se considera necesario.   Si se requiere una autorizacin previa para que su compaa de seguros [place] su medicamento, por favor permtanos de 1 a 2  das hbiles para completar este proceso.  Los precios de los medicamentos varan con frecuencia dependiendo del Environmental consultant de dnde se surte la receta y alguna farmacias pueden ofrecer precios ms baratos.  El sitio web www.goodrx.com tiene cupones para medicamentos de Health and safety inspector. Los precios aqu no tienen en cuenta lo que podra costar con la ayuda del seguro (puede ser ms barato con su seguro), pero el sitio web puede darle el precio si no utiliz Tourist information centre manager.  - Puede imprimir el cupn correspondiente y llevarlo con su receta a la farmacia.  - Tambin puede pasar por nuestra oficina durante el horario de atencin regular y Education officer, museum una tarjeta de cupones de GoodRx.  - Si necesita que su receta se enve electrnicamente a una farmacia diferente, informe a nuestra oficina a travs de MyChart de [place] o por telfono llamando al [phone] y presione la opcin 4.

## 2024-02-04 NOTE — Progress Notes (Unsigned)
 Follow-Up Visit   Subjective  Keith Jackson is a 63 y.o. male who presents for the following: Skin Cancer Screening and Full Body Skin Exam  The patient presents for Total-Body Skin Exam (TBSE) for skin cancer screening and mole check. The patient has spots, moles and lesions to be evaluated, some may be new or changing. He is taking doxycycline  20 MG daily for rosacea with ocular rosacea. Patient has questions about oral treatment for alopecia. History of dysplastic nevi. History of AKs.   The following portions of the chart were reviewed this encounter and updated as appropriate: medications, allergies, medical history  Review of Systems:  No other skin or systemic complaints except as noted in HPI or Assessment and Plan.  Objective  Well appearing patient in no apparent distress; mood and affect are within normal limits.  A full examination was performed including scalp, head, eyes, ears, nose, lips, neck, chest, axillae, abdomen, back, buttocks, bilateral upper extremities, bilateral lower extremities, hands, feet, fingers, toes, fingernails, and toenails. All findings within normal limits unless otherwise noted below.   Relevant physical exam findings are noted in the Assessment and Plan.  R frontal scalp x 1, R forehead x 1, R temple x 3, L temple x 1 (6) Pink scaly macules.   Assessment & Plan   SKIN CANCER SCREENING PERFORMED TODAY.  ACTINIC DAMAGE - Chronic condition, secondary to cumulative UV/sun exposure - diffuse scaly erythematous macules with underlying dyspigmentation - Recommend daily broad spectrum sunscreen SPF 30+ to sun-exposed areas, reapply every 2 hours as needed.  - Staying in the shade or wearing long sleeves, sun glasses (UVA+UVB protection) and wide brim hats (4-inch brim around the entire circumference of the hat) are also recommended for sun protection.  - Call for new or changing lesions.  LENTIGINES, SEBORRHEIC KERATOSES, HEMANGIOMAS - Benign  normal skin lesions - Benign-appearing - Call for any changes  MELANOCYTIC NEVI - Tan-brown and/or pink-flesh-colored symmetric macules and papules - L upper occipital scalp, 3 mm with adjacent 2 mm firm pink flesh papules (vs Cyst) - L frontal scalp with 4 x 3 mm firm flesh papule (vs SK) - Benign appearing on exam today - Observation - Call clinic for new or changing moles - Recommend daily use of broad spectrum spf 30+ sunscreen to sun-exposed areas.   Dermatofibroma - Firm pink/brown papulenodule with dimple sign at left medial knee and right upper knee  - Benign appearing - Call for any changes  History of Dysplastic Nevi left mid to upper back/mod 2009 and left mid to lat tricep mild 2014 - No evidence of recurrence today - Recommend regular full body skin exams - Recommend daily broad spectrum sunscreen SPF 30+ to sun-exposed areas, reapply every 2 hours as needed.  - Call if any new or changing lesions are noted between office visits  Rosacea with ocular rosacea  Face Redness and telangectasia at BL cheeks    Chronic condition with duration or expected duration over one year. Currently well-controlled.   Rosacea is a chronic progressive skin condition usually affecting the face of adults, causing redness and/or acne bumps. It is treatable but not curable. It sometimes affects the eyes (ocular rosacea) as well. It may respond to topical and/or systemic medication and can flare with stress, sun exposure, alcohol, exercise and some foods.  Daily application of broad spectrum spf 30+ sunscreen to face is recommended to reduce flares.   Continue doxycyline 20 mg capsule  take 2 cap qd with food  prn flares. Patient currently taking 20 mg daily. Patient does not want refills sent in, he will call when needed.    Doxycycline  should be taken with food to prevent nausea. Do not lay down for 30 minutes after taking. Be cautious with sun exposure and use good sun protection while on  this medication. Pregnant women should not take this medication.    NAIL DYSTROPHY Exam: Distal onycholysis left thumb nail.  Treatment: Patient tried Kerydin  topical with no improvement. Patient was also treating tinea unguium with oral treatment and had no improvement with thumb nail. Prefers no treatment at this time.  IMPETIGO Exam: L inf nasal ala rim with mild erythema today, pt reports getting sores in same area that come and go.  Treatment: Start mupirocin 2% ointment twice daily. Discussed preventative treatment twice daily x 1 week inside nostrils vs treating with symptoms. Patient defers Rx being sent in, will call if needed.   ANDROGENETIC ALOPECIA (MALE PATTERN HAIR LOSS) Exam: Frontal scalp thinning with intact frontal hairline and miniaturization   Chronic and persistent condition with duration or expected duration over one year. Condition is symptomatic/ bothersome to patient. Not currently at goal.   Androgenetic Alopecia (or Male pattern hair loss) refers to the common patterned hair loss affecting many men.  Male pattern alopecia is mediated by dihydrotestosterone which induces miniaturization of androgen-sensitive hair follicles.  It is chronic and persistent, but treatable; not curable. Topical treatment includes: - 5% topical Minoxidil Oral treatment includes: - Finasteride 1 mg qd - Minoxidil 1.25 - 5 mg qd - Dutasteride 0.5 mg qd Adjunct therapy includes: - Low Level Laser Light Therapy (LLLT) - Platelet-rich Plasma injections (PRP) - Hair Transplantation or scalp reduction  Treatment Plan: Oral minoxidil treatment discussed. Patient will call back if he would like to start treatment.   Doses of oral minoxidil for hair loss are considered 'low dose'. This is because the doses used for hair loss are much lower than the doses which are used for conditions such as high blood pressure (hypertension). The doses used for hypertension are 10-40mg  per day.  Side  effects are uncommon at the low doses (up to 2.5 mg/day) used to treat hair loss. Potential side effects, more commonly seen at higher doses, include: Increase in hair growth (hypertrichosis) elsewhere on face and body Temporary hair shedding upon starting medication which may last up to 4 weeks Ankle swelling, fluid retention, rapid weight gain more than 5 pounds Low blood pressure and feeling lightheaded or dizzy when standing up quickly Fast or irregular heartbeat Headaches    SEBORRHEIC DERMATITIS Exam: Erythema with scale at ear canals.  Chronic and persistent condition with duration or expected duration over one year. Condition is symptomatic/ bothersome to patient. Not currently at goal.   Seborrheic Dermatitis is a chronic persistent rash characterized by pinkness and scaling most commonly of the mid face but also can occur on the scalp (dandruff), ears; mid chest, mid back and groin.  It tends to be exacerbated by stress and cooler weather.  People who have neurologic disease may experience new onset or exacerbation of existing seborrheic dermatitis.  The condition is not curable but treatable and can be controlled.  Treatment Plan: Patient defers Rx treatment at this time. Start 1% hydrocortisone cream to ears twice daily as needed for itchy rash.   Start over the counter Lotrimin or Micatin cream to affected ear 1-2x daily Recommend Head & Shoulders shampoo to scalp and ears daily.  AK (ACTINIC  KERATOSIS) (6) R frontal scalp x 1, R forehead x 1, R temple x 3, L temple x 1 (6) Actinic keratoses are precancerous spots that appear secondary to cumulative UV radiation exposure/sun exposure over time. They are chronic with expected duration over 1 year. A portion of actinic keratoses will progress to squamous cell carcinoma of the skin. It is not possible to reliably predict which spots will progress to skin cancer and so treatment is recommended to prevent development of skin  cancer.  Recommend daily broad spectrum sunscreen SPF 30+ to sun-exposed areas, reapply every 2 hours as needed.  Recommend staying in the shade or wearing long sleeves, sun glasses (UVA+UVB protection) and wide brim hats (4-inch brim around the entire circumference of the hat). Call for new or changing lesions. Destruction of lesion - R frontal scalp x 1, R forehead x 1, R temple x 3, L temple x 1 (6)  Destruction method: cryotherapy   Informed consent: discussed and consent obtained   Lesion destroyed using liquid nitrogen: Yes   Region frozen until ice ball extended beyond lesion: Yes   Outcome: patient tolerated procedure well with no complications   Post-procedure details: wound care instructions given   Additional details:  Prior to procedure, discussed risks of blister formation, small wound, skin dyspigmentation, or rare scar following cryotherapy. Recommend Vaseline ointment to treated areas while healing.  ROSACEA   Related Medications doxycycline  (PERIOSTAT ) 20 MG tablet Take 2 tablets (40 mg total) by mouth as directed. Take with food and drink prn for flares Return in about 1 year (around 02/03/2025) for TBSE, Hx Dysplastic Nevus, Hx AKs.  IBernardine Bridegroom, CMA, am acting as scribe for Artemio Larry, MD .   Documentation: I have reviewed the above documentation for accuracy and completeness, and I agree with the above.  Artemio Larry, MD

## 2024-02-05 ENCOUNTER — Telehealth: Payer: Self-pay

## 2024-02-05 MED ORDER — MINOXIDIL 2.5 MG PO TABS: 2.5000 mg | ORAL_TABLET | ORAL | 1 refills | Status: DC

## 2024-02-05 NOTE — Telephone Encounter (Signed)
 Patient called stating he would like to start the Minoxidil now.  What dosing regimen would you like for pt?  Pharmacy: [facility] [address]

## 2024-02-05 NOTE — Telephone Encounter (Signed)
Prescription sent in. aw

## 2024-02-11 ENCOUNTER — Other Ambulatory Visit: Payer: Self-pay | Admitting: Dermatology

## 2024-02-11 ENCOUNTER — Other Ambulatory Visit: Payer: Self-pay

## 2024-02-11 DIAGNOSIS — L719 Rosacea, unspecified: Secondary | ICD-10-CM

## 2024-02-11 MED ORDER — MINOXIDIL 2.5 MG PO TABS: 2.5000 mg | ORAL_TABLET | Freq: Two times a day (BID) | ORAL | 1 refills | Status: AC

## 2024-02-11 NOTE — Progress Notes (Signed)
 New RX sent in for clarifying directions. aw

## 2024-02-28 ENCOUNTER — Encounter: Payer: Self-pay | Admitting: Gastroenterology

## 2024-03-02 ENCOUNTER — Ambulatory Visit
Admission: RE | Admit: 2024-03-02 | Discharge: 2024-03-02 | Disposition: A | Discharge status: Home / Self Care | Attending: Gastroenterology | Admitting: Gastroenterology

## 2024-03-02 ENCOUNTER — Encounter
Admission: RE | Disposition: A | Discharge status: Home / Self Care | Payer: Self-pay | Source: Home / Self Care | Attending: Gastroenterology

## 2024-03-02 ENCOUNTER — Ambulatory Visit: Admitting: Anesthesiology

## 2024-03-02 ENCOUNTER — Encounter: Payer: Self-pay | Admitting: Gastroenterology

## 2024-03-02 DIAGNOSIS — K648 Other hemorrhoids: Secondary | ICD-10-CM | POA: Diagnosis not present

## 2024-03-02 DIAGNOSIS — K5730 Diverticulosis of large intestine without perforation or abscess without bleeding: Secondary | ICD-10-CM | POA: Insufficient documentation

## 2024-03-02 DIAGNOSIS — Z1211 Encounter for screening for malignant neoplasm of colon: Secondary | ICD-10-CM | POA: Insufficient documentation

## 2024-03-02 HISTORY — PX: COLONOSCOPY: SHX5424

## 2024-03-02 SURGERY — COLONOSCOPY
Anesthesia: General

## 2024-03-02 MED ORDER — EPHEDRINE 5 MG/ML INJ
INTRAVENOUS | Status: AC
  Filled 2024-03-02: qty 5

## 2024-03-02 MED ORDER — PROPOFOL 10 MG/ML IV BOLUS
INTRAVENOUS | Status: DC | PRN
  Administered 2024-03-02: 30 mg via INTRAVENOUS
  Administered 2024-03-02: 50 mg via INTRAVENOUS

## 2024-03-02 MED ORDER — EPHEDRINE SULFATE-NACL 50-0.9 MG/10ML-% IV SOSY
PREFILLED_SYRINGE | INTRAVENOUS | Status: DC | PRN
  Administered 2024-03-02: 15 mg via INTRAVENOUS
  Administered 2024-03-02: 10 mg via INTRAVENOUS

## 2024-03-02 MED ORDER — SODIUM CHLORIDE 0.9 % IV SOLN
INTRAVENOUS | Status: DC
  Administered 2024-03-02: 500 mL via INTRAVENOUS

## 2024-03-02 MED ORDER — LIDOCAINE HCL (CARDIAC) PF 100 MG/5ML IV SOSY
PREFILLED_SYRINGE | INTRAVENOUS | Status: DC | PRN
  Administered 2024-03-02: 60 mg via INTRAVENOUS

## 2024-03-02 MED ORDER — LIDOCAINE HCL (PF) 2 % IJ SOLN
INTRAMUSCULAR | Status: AC
  Filled 2024-03-02: qty 5

## 2024-03-02 MED ORDER — SIMETHICONE 40 MG/0.6ML PO SUSP
ORAL | Status: DC | PRN
  Administered 2024-03-02: 120 mL

## 2024-03-02 MED ORDER — DEXMEDETOMIDINE HCL IN NACL 80 MCG/20ML IV SOLN
INTRAVENOUS | Status: DC | PRN
  Administered 2024-03-02: 20 ug via INTRAVENOUS

## 2024-03-02 MED ORDER — PROPOFOL 500 MG/50ML IV EMUL
INTRAVENOUS | Status: DC | PRN
  Administered 2024-03-02: 75 ug/kg/min via INTRAVENOUS

## 2024-03-02 NOTE — Interval H&P Note (Signed)
 History and Physical Interval Note: Preprocedure H&P from 03/02/24  was reviewed and there was no interval change after seeing and examining the patient.  Written consent was obtained from the patient after discussion of risks, benefits, and alternatives. Patient has consented to proceed with Colonoscopy with possible intervention   03/02/2024 10:32 AM  Keith Jackson  has presented today for surgery, with the diagnosis of History of adenomatous polyp of colon (Z86.0101).  The various methods of treatment have been discussed with the patient and family. After consideration of risks, benefits and other options for treatment, the patient has consented to  Procedure(s) with comments: COLONOSCOPY (N/A) - REQUESTS MID AM APPOINTMENT as a surgical intervention.  The patient's history has been reviewed, patient examined, no change in status, stable for surgery.  I have reviewed the patient's chart and labs.  Questions were answered to the patient's satisfaction.     Quintin Buckle

## 2024-03-02 NOTE — Anesthesia Preprocedure Evaluation (Signed)
 Anesthesia Evaluation  Patient identified by MRN, date of birth, ID band Patient awake    Reviewed: Allergy & Precautions, NPO status , Patient's Chart, lab work & pertinent test results  History of Anesthesia Complications Negative for: history of anesthetic complications  Airway Mallampati: III  TM Distance: >3 FB Neck ROM: full    Dental  (+) Chipped, Dental Advidsory Given   Pulmonary neg pulmonary ROS, neg shortness of breath   Pulmonary exam normal        Cardiovascular Exercise Tolerance: Good negative cardio ROS Normal cardiovascular exam     Neuro/Psych  Neuromuscular disease  negative psych ROS   GI/Hepatic negative GI ROS, Neg liver ROS,neg GERD  ,,  Endo/Other  negative endocrine ROS    Renal/GU      Musculoskeletal   Abdominal   Peds  Hematology negative hematology ROS (+)   Anesthesia Other Findings Past Medical History: 05/11/2008: History of dysplastic nevus     Comment:  left mid to upper back/moderate 06/22/2013: History of dysplastic nevus     Comment:  left mid to lateral tricep/mild No date: History of kidney stones  Past Surgical History: 09/02/2015: COLONOSCOPY WITH PROPOFOL ; N/A     Comment:  Procedure: COLONOSCOPY WITH PROPOFOL ;  Surgeon: Cassie Click, MD;  Location: [facility] ENDOSCOPY;  Service:               Endoscopy;  Laterality: N/A; 01/09/2021: EXTRACORPOREAL SHOCK WAVE LITHOTRIPSY; Left     Comment:  Procedure: EXTRACORPOREAL SHOCK WAVE LITHOTRIPSY (ESWL);              Surgeon: Homero Luster, MD;  Location: [facility];  Service: Urology;  Laterality: Left; 2010-12: LITHOTRIPSY  BMI    Body Mass Index: 22.79 kg/m      Reproductive/Obstetrics negative OB ROS                             Anesthesia Physical Anesthesia Plan  ASA: 2  Anesthesia Plan: General   Post-op Pain Management: Minimal or no pain  anticipated   Induction: Intravenous  PONV Risk Score and Plan: 3 and Propofol  infusion, TIVA and Ondansetron   Airway Management Planned: Nasal Cannula  Additional Equipment: None  Intra-op Plan:   Post-operative Plan:   Informed Consent: I have reviewed the patients History and Physical, chart, labs and discussed the procedure including the risks, benefits and alternatives for the proposed anesthesia with the patient or authorized representative who has indicated his/her understanding and acceptance.     Dental advisory given  Plan Discussed with: CRNA and Surgeon  Anesthesia Plan Comments: (Discussed risks of anesthesia with patient, including possibility of difficulty with spontaneous ventilation under anesthesia necessitating airway intervention, PONV, and rare risks such as cardiac or respiratory or neurological events, and allergic reactions. Discussed the role of CRNA in patient's perioperative care. Patient understands.)       Anesthesia Quick Evaluation

## 2024-03-02 NOTE — Transfer of Care (Signed)
 Immediate Anesthesia Transfer of Care Note  Patient: Keith Jackson  Procedure(s) Performed: COLONOSCOPY  Patient Location: PACU  Anesthesia Type:General  Level of Consciousness: sedated  Airway & Oxygen Therapy: Patient Spontanous Breathing  Post-op Assessment: Report given to RN and Post -op Vital signs reviewed and stable  Post vital signs: Reviewed and stable  Last Vitals:  Vitals Value Taken Time  BP 107/73 03/02/24 1058  Temp    Pulse 89 03/02/24 1059  Resp 18 03/02/24 1059  SpO2 98 % 03/02/24 1059  Vitals shown include unfiled device data.  Last Pain:  Vitals:   03/02/24 1005  TempSrc: Oral  PainSc: 0-No pain         Complications: No notable events documented.

## 2024-03-02 NOTE — Op Note (Signed)
 [facility] Patient Name: Keith Jackson Procedure Date: 03/02/2024 10:21 AM MRN: [id number] Account #: [id number] Date of Birth: Jul 28, 1961 Admit Type: Outpatient Age: 63 Room: [facility] ENDO ROOM 2 Gender: Male Note Status: Finalized Instrument Name: Colonoscope [id number] Procedure:             Colonoscopy Indications:           High risk colon cancer surveillance: Personal history                         of colonic polyps Providers:             Bridgett Camps, DO Referring MD:          Rosalynn Come. Claudius Cumins, MD (Referring MD) Medicines:             Monitored Anesthesia Care Complications:         No immediate complications. Estimated blood loss: None. Procedure:             Pre-Anesthesia Assessment:                        - Prior to the procedure, a History and Physical was                         performed, and patient medications and allergies were                         reviewed. The patient is competent. The risks and                         benefits of the procedure and the sedation options and                         risks were discussed with the patient. All questions                         were answered and informed consent was obtained.                         Patient identification and proposed procedure were                         verified by the physician, the nurse, the anesthetist                         and the technician in the endoscopy suite. Mental                         Status Examination: alert and oriented. Airway                         Examination: normal oropharyngeal airway and neck                         mobility. Respiratory Examination: clear to                         auscultation. CV Examination: RRR, no murmurs, no S3  or S4. Prophylactic Antibiotics: The patient does not                         require prophylactic antibiotics. Prior                         Anticoagulants: The patient  has taken no anticoagulant                         or antiplatelet agents. ASA Grade Assessment: II - A                         patient with mild systemic disease. After reviewing                         the risks and benefits, the patient was deemed in                         satisfactory condition to undergo the procedure. The                         anesthesia plan was to use monitored anesthesia care                         (MAC). Immediately prior to administration of                         medications, the patient was re-assessed for adequacy                         to receive sedatives. The heart rate, respiratory                         rate, oxygen saturations, blood pressure, adequacy of                         pulmonary ventilation, and response to care were                         monitored throughout the procedure. The physical                         status of the patient was re-assessed after the                         procedure.                        After obtaining informed consent, the colonoscope was                         passed under direct vision. Throughout the procedure,                         the patient's blood pressure, pulse, and oxygen                         saturations were monitored continuously. The  Colonoscope was introduced through the anus and                         advanced to the the terminal ileum, with                         identification of the appendiceal orifice and IC                         valve. The colonoscopy was performed without                         difficulty. The patient tolerated the procedure well.                         The quality of the bowel preparation was evaluated                         using the BBPS [facility] Bowel Preparation Scale) with                         scores of: Right Colon = 3 (entire mucosa seen well                         with no residual staining, small fragments of stool or                          opaque liquid), Transverse Colon = 3 (entire mucosa                         seen well with no residual staining, small fragments                         of stool or opaque liquid) and Left Colon = 2 (minor                         amount of residual staining, small fragments of stool                         and/or opaque liquid, but mucosa seen well). The total                         BBPS score equals 8. The quality of the bowel                         preparation was excellent. The terminal ileum,                         ileocecal valve, appendiceal orifice, and rectum were                         photographed. Findings:      The perianal and digital rectal examinations were normal. Pertinent       negatives include normal sphincter tone.      The terminal ileum appeared normal. Estimated blood loss: none.      Retroflexion in the right colon was performed.  Multiple small-mouthed diverticula were found in the sigmoid colon.       Estimated blood loss: none.      The exam was otherwise without abnormality on direct and retroflexion       views. Impression:            - The examined portion of the ileum was normal.                        - Diverticulosis in the sigmoid colon.                        - The examination was otherwise normal on direct and                         retroflexion views.                        - No specimens collected. Recommendation:        - Patient has a contact number available for                         emergencies. The signs and symptoms of potential                         delayed complications were discussed with the patient.                         Return to normal activities tomorrow. Written                         discharge instructions were provided to the patient.                        - Discharge patient to home.                        - Resume previous diet.                        - Continue present medications.                         - Repeat colonoscopy in 7 years for screening purposes.                        - Return to referring physician as previously                         scheduled.                        - The findings and recommendations were discussed with                         the patient. Procedure Code(s):     --- Professional ---                        [id number], Colonoscopy, flexible; diagnostic, including  collection of specimen(s) by brushing or washing, when                         performed (separate procedure) Diagnosis Code(s):     --- Professional ---                        Z86.010, Personal history of colonic polyps                        K57.30, Diverticulosis of large intestine without                         perforation or abscess without bleeding CPT copyright 2022 [employer]. All rights reserved. The codes documented in this report are preliminary and upon coder review may  be revised to meet current compliance requirements. Attending Participation:      I personally performed the entire procedure. Polo Brisk, DO Quintin Buckle DO, DO 03/02/2024 10:58:48 AM This report has been signed electronically. Number of Addenda: 0 Note Initiated On: 03/02/2024 10:21 AM Scope Withdrawal Time: 0 hours 10 minutes 52 seconds  Total Procedure Duration: 0 hours 14 minutes 50 seconds  Estimated Blood Loss:  Estimated blood loss: none.      [facility]

## 2024-03-02 NOTE — H&P (Signed)
 Pre-Procedure H&P   Patient ID: Keith Jackson is a 63 y.o. male.  Gastroenterology Provider: Quintin Buckle, DO  Referring Provider: Dr. Claudius Cumins PCP: Yehuda Helms, MD  Date: 03/02/2024  HPI Keith Jackson is a 63 y.o. male who presents today for Colonoscopy for phx colon polyps.  Reports daily bowel movement without melena or hematochezia   Last underwent colonoscopy in July 2024.  Poor prep.  1 adenomatous polyp removed.  Internal hemorrhoids and hypertrophied anal papilla   2016 colonoscopy TAs removed   Hemoglobin 16.8 MCV 90 creatinine 1.1    Past Medical History:  Diagnosis Date   History of dysplastic nevus 05/11/2008   left mid to upper back/moderate   History of dysplastic nevus 06/22/2013   left mid to lateral tricep/mild   History of kidney stones     Past Surgical History:  Procedure Laterality Date   COLONOSCOPY WITH PROPOFOL  N/A 09/02/2015   Procedure: COLONOSCOPY WITH PROPOFOL ;  Surgeon: Cassie Click, MD;  Location: [facility] ENDOSCOPY;  Service: Endoscopy;  Laterality: N/A;   COLONOSCOPY WITH PROPOFOL  N/A 04/15/2023   Procedure: COLONOSCOPY WITH PROPOFOL ;  Surgeon: Quintin Buckle, DO;  Location: [facility] ENDOSCOPY;  Service: Gastroenterology;  Laterality: N/A;   EXTRACORPOREAL SHOCK WAVE LITHOTRIPSY Left 01/09/2021   Procedure: EXTRACORPOREAL SHOCK WAVE LITHOTRIPSY (ESWL);  Surgeon: Homero Luster, MD;  Location: [facility];  Service: Urology;  Laterality: Left;   LITHOTRIPSY  2010-12   POLYPECTOMY  04/15/2023   Procedure: POLYPECTOMY;  Surgeon: Quintin Buckle, DO;  Location: [facility] ENDOSCOPY;  Service: Gastroenterology;;    Family History No h/o GI disease or malignancy  Review of Systems  Constitutional:  Negative for activity change, appetite change, chills, diaphoresis, fatigue, fever and unexpected weight change.  HENT:  Negative for trouble swallowing and voice change.   Respiratory:  Negative for shortness of  breath and wheezing.   Cardiovascular:  Negative for chest pain, palpitations and leg swelling.  Gastrointestinal:  Negative for abdominal distention, abdominal pain, anal bleeding, blood in stool, constipation, diarrhea, nausea and vomiting.  Musculoskeletal:  Negative for arthralgias and myalgias.  Skin:  Negative for color change and pallor.  Neurological:  Negative for dizziness, syncope and weakness.  Psychiatric/Behavioral:  Negative for confusion. The patient is not nervous/anxious.   All other systems reviewed and are negative.    Medications No current facility-administered medications on file prior to encounter.   Current Outpatient Medications on File Prior to Encounter  Medication Sig Dispense Refill   aspirin EC 81 MG tablet Take 81 mg by mouth daily. Swallow whole.     rosuvastatin (CRESTOR) 5 MG tablet Take 5 mg by mouth daily.      Pertinent medications related to GI and procedure were reviewed by me with the patient prior to the procedure   Current Facility-Administered Medications:    0.9 %  sodium chloride  infusion, , Intravenous, Continuous, Quintin Buckle, DO, Last Rate: 20 mL/hr at 03/02/24 1009, 500 mL at 03/02/24 1009  sodium chloride  500 mL (03/02/24 1009)       No Known Allergies Allergies were reviewed by me prior to the procedure  Objective   Body mass index is 22.45 kg/m. Vitals:   03/02/24 1005  BP: 126/85  Pulse: 85  Resp: 18  Temp: 98 F (36.7 C)  TempSrc: Oral  SpO2: 97%  Weight: 68.9 kg  Height: 5\' 9"  (1.753 m)     Physical Exam Vitals and nursing note reviewed.  Constitutional:  General: He is not in acute distress.    Appearance: Normal appearance. He is not ill-appearing, toxic-appearing or diaphoretic.  HENT:     Head: Normocephalic and atraumatic.     Nose: Nose normal.     Mouth/Throat:     Mouth: Mucous membranes are moist.     Pharynx: Oropharynx is clear.  Eyes:     General: No scleral icterus.     Extraocular Movements: Extraocular movements intact.  Cardiovascular:     Rate and Rhythm: Normal rate and regular rhythm.     Heart sounds: Normal heart sounds. No murmur heard.    No friction rub. No gallop.  Pulmonary:     Effort: Pulmonary effort is normal. No respiratory distress.     Breath sounds: Normal breath sounds. No wheezing, rhonchi or rales.  Abdominal:     General: Bowel sounds are normal. There is no distension.     Palpations: Abdomen is soft.     Tenderness: There is no abdominal tenderness. There is no guarding or rebound.  Musculoskeletal:     Cervical back: Neck supple.     Right lower leg: No edema.     Left lower leg: No edema.  Skin:    General: Skin is warm and dry.     Coloration: Skin is not jaundiced or pale.  Neurological:     General: No focal deficit present.     Mental Status: He is alert and oriented to person, place, and time. Mental status is at baseline.  Psychiatric:        Mood and Affect: Mood normal.        Behavior: Behavior normal.        Thought Content: Thought content normal.        Judgment: Judgment normal.      Assessment:  Keith Jackson is a 63 y.o. male  who presents today for Colonoscopy for phx colon polyps.  Plan:  Colonoscopy with possible intervention today  Colonoscopy with possible biopsy, control of bleeding, polypectomy, and interventions as necessary has been discussed with the patient/patient representative. Informed consent was obtained from the patient/patient representative after explaining the indication, nature, and risks of the procedure including but not limited to death, bleeding, perforation, missed neoplasm/lesions, cardiorespiratory compromise, and reaction to medications. Opportunity for questions was given and appropriate answers were provided. Patient/patient representative has verbalized understanding is amenable to undergoing the procedure.   Quintin Buckle, DO  [facility]  Gastroenterology  Portions of the record may have been created with voice recognition software. Occasional wrong-word or 'sound-a-like' substitutions may have occurred due to the inherent limitations of voice recognition software.  Read the chart carefully and recognize, using context, where substitutions may have occurred.

## 2024-03-02 NOTE — Anesthesia Postprocedure Evaluation (Signed)
 Anesthesia Post Note  Patient: Keith Jackson  Procedure(s) Performed: COLONOSCOPY  Patient location during evaluation: Endoscopy Anesthesia Type: General Level of consciousness: awake and alert Pain management: pain level controlled Vital Signs Assessment: post-procedure vital signs reviewed and stable Respiratory status: spontaneous breathing, nonlabored ventilation, respiratory function stable and patient connected to nasal cannula oxygen Cardiovascular status: blood pressure returned to baseline and stable Postop Assessment: no apparent nausea or vomiting Anesthetic complications: no  No notable events documented.   Last Vitals:  Vitals:   03/02/24 1059 03/02/24 1118  BP:  110/69  Pulse:    Resp:    Temp: 36.5 C   SpO2:      Last Pain:  Vitals:   03/02/24 1118  TempSrc:   PainSc: 0-No pain                 Enrique Harvest

## 2024-03-03 ENCOUNTER — Encounter: Payer: Self-pay | Admitting: Gastroenterology

## 2024-05-19 ENCOUNTER — Other Ambulatory Visit: Payer: Self-pay

## 2024-05-19 MED ORDER — DOXYCYCLINE MONOHYDRATE 100 MG PO CAPS
100.0000 mg | ORAL_CAPSULE | Freq: Two times a day (BID) | ORAL | 1 refills | Status: AC
Start: 2024-05-19 — End: ?

## 2024-05-19 NOTE — Telephone Encounter (Signed)
 Patient requesting a refill of Doxycycline  for Tick bite,   Doxycycline  100 mg #60 1 Refill sent to [facility] pharmacy

## 2024-08-19 ENCOUNTER — Other Ambulatory Visit: Payer: Self-pay | Admitting: Internal Medicine

## 2024-08-19 ENCOUNTER — Ambulatory Visit
Admission: RE | Admit: 2024-08-19 | Discharge: 2024-08-19 | Disposition: A | Discharge status: Home / Self Care | Source: Ambulatory Visit | Attending: Internal Medicine | Admitting: Internal Medicine

## 2024-08-19 DIAGNOSIS — R609 Edema, unspecified: Secondary | ICD-10-CM | POA: Diagnosis present

## 2024-08-19 DIAGNOSIS — R52 Pain, unspecified: Secondary | ICD-10-CM

## 2024-09-28 ENCOUNTER — Other Ambulatory Visit: Payer: Self-pay | Admitting: Internal Medicine

## 2024-09-28 DIAGNOSIS — M79662 Pain in left lower leg: Secondary | ICD-10-CM

## 2024-09-28 DIAGNOSIS — M7989 Other specified soft tissue disorders: Secondary | ICD-10-CM

## 2024-09-29 ENCOUNTER — Ambulatory Visit
Admission: RE | Admit: 2024-09-29 | Discharge: 2024-09-29 | Disposition: A | Discharge status: Home / Self Care | Source: Ambulatory Visit | Attending: Internal Medicine | Admitting: Internal Medicine

## 2024-09-29 DIAGNOSIS — M7989 Other specified soft tissue disorders: Secondary | ICD-10-CM | POA: Diagnosis present

## 2024-09-29 DIAGNOSIS — M79662 Pain in left lower leg: Secondary | ICD-10-CM | POA: Insufficient documentation

## 2024-09-29 MED ORDER — GADOBUTROL 1 MMOL/ML IV SOLN
7.0000 mL | Freq: Once | INTRAVENOUS | Status: AC | PRN
  Administered 2024-09-29: 7 mL via INTRAVENOUS

## 2025-02-02 ENCOUNTER — Ambulatory Visit: Admitting: Dermatology
